# Patient Record
Sex: Female | Born: 1940 | Race: White | Hispanic: No | Marital: Married | Smoking: Never smoker
Health system: Southern US, Community
[De-identification: ages and names within clinical notes are randomized; demographics above are authoritative.]

## PROBLEM LIST (undated history)

## (undated) DIAGNOSIS — E785 Hyperlipidemia, unspecified: Secondary | ICD-10-CM

## (undated) DIAGNOSIS — T7840XA Allergy, unspecified, initial encounter: Secondary | ICD-10-CM

## (undated) DIAGNOSIS — F32A Depression, unspecified: Secondary | ICD-10-CM

## (undated) DIAGNOSIS — F419 Anxiety disorder, unspecified: Secondary | ICD-10-CM

## (undated) DIAGNOSIS — E119 Type 2 diabetes mellitus without complications: Secondary | ICD-10-CM

## (undated) DIAGNOSIS — F329 Major depressive disorder, single episode, unspecified: Secondary | ICD-10-CM

## (undated) DIAGNOSIS — I10 Essential (primary) hypertension: Secondary | ICD-10-CM

## (undated) HISTORY — DX: Anxiety disorder, unspecified: F41.9

## (undated) HISTORY — DX: Depression, unspecified: F32.A

## (undated) HISTORY — DX: Allergy, unspecified, initial encounter: T78.40XA

## (undated) HISTORY — DX: Essential (primary) hypertension: I10

## (undated) HISTORY — DX: Hyperlipidemia, unspecified: E78.5

## (undated) HISTORY — DX: Type 2 diabetes mellitus without complications: E11.9

## (undated) HISTORY — DX: Major depressive disorder, single episode, unspecified: F32.9

---

## 2006-11-18 ENCOUNTER — Encounter: Admission: RE | Admit: 2006-11-18 | Discharge: 2006-11-18 | Payer: Self-pay | Admitting: Family Medicine

## 2006-11-19 ENCOUNTER — Encounter: Admission: RE | Admit: 2006-11-19 | Discharge: 2006-11-19 | Payer: Self-pay | Admitting: Family Medicine

## 2008-09-13 ENCOUNTER — Encounter: Admission: RE | Admit: 2008-09-13 | Discharge: 2008-09-13 | Payer: Self-pay | Admitting: Family Medicine

## 2009-09-01 ENCOUNTER — Encounter: Admission: RE | Admit: 2009-09-01 | Discharge: 2009-09-01 | Payer: Self-pay | Admitting: Family Medicine

## 2010-02-01 ENCOUNTER — Encounter: Payer: Self-pay | Admitting: Family Medicine

## 2010-05-20 ENCOUNTER — Other Ambulatory Visit: Payer: Self-pay | Admitting: Orthopaedic Surgery

## 2010-05-20 ENCOUNTER — Encounter (HOSPITAL_COMMUNITY): Payer: Medicare Other | Attending: Orthopaedic Surgery

## 2010-05-20 DIAGNOSIS — M1610 Unilateral primary osteoarthritis, unspecified hip: Secondary | ICD-10-CM | POA: Insufficient documentation

## 2010-05-20 DIAGNOSIS — I10 Essential (primary) hypertension: Secondary | ICD-10-CM | POA: Insufficient documentation

## 2010-05-20 DIAGNOSIS — M169 Osteoarthritis of hip, unspecified: Secondary | ICD-10-CM | POA: Insufficient documentation

## 2010-05-20 DIAGNOSIS — Z01812 Encounter for preprocedural laboratory examination: Secondary | ICD-10-CM | POA: Insufficient documentation

## 2010-05-20 DIAGNOSIS — I2510 Atherosclerotic heart disease of native coronary artery without angina pectoris: Secondary | ICD-10-CM | POA: Insufficient documentation

## 2010-05-20 LAB — SURGICAL PCR SCREEN
MRSA, PCR: NEGATIVE
Staphylococcus aureus: POSITIVE — AB

## 2010-05-20 LAB — BASIC METABOLIC PANEL
BUN: 9 mg/dL (ref 6–23)
Calcium: 10.1 mg/dL (ref 8.4–10.5)
Chloride: 99 mEq/L (ref 96–112)
GFR calc Af Amer: >60 mL/min (ref >60)
Glucose, Bld: 96 mg/dL (ref 70–99)
Potassium: 4.3 mEq/L (ref 3.5–5.1)
Sodium: 136 mEq/L (ref 135–145)

## 2010-05-20 LAB — APTT: aPTT: 31 seconds (ref 24–37)

## 2010-05-20 LAB — CBC
MCHC: 33.8 g/dL (ref 30.0–36.0)
Platelets: 193 10*3/uL (ref 150–400)
RDW: 12.9 % (ref 11.5–15.5)
WBC: 6.4 10*3/uL (ref 4.0–10.5)

## 2010-05-20 LAB — PROTIME-INR: INR: 0.99 (ref 0.00–1.49)

## 2010-05-29 ENCOUNTER — Inpatient Hospital Stay (HOSPITAL_COMMUNITY): Payer: Medicare Other

## 2010-05-29 ENCOUNTER — Inpatient Hospital Stay (HOSPITAL_COMMUNITY)
Admission: RE | Admit: 2010-05-29 | Discharge: 2010-06-02 | DRG: 470 | Disposition: A | Discharge status: Skilled Nursing Facility | Payer: Medicare Other | Source: Ambulatory Visit | Attending: Orthopaedic Surgery | Admitting: Orthopaedic Surgery

## 2010-05-29 DIAGNOSIS — E785 Hyperlipidemia, unspecified: Secondary | ICD-10-CM | POA: Diagnosis present

## 2010-05-29 DIAGNOSIS — F411 Generalized anxiety disorder: Secondary | ICD-10-CM | POA: Diagnosis present

## 2010-05-29 DIAGNOSIS — Z01812 Encounter for preprocedural laboratory examination: Secondary | ICD-10-CM

## 2010-05-29 DIAGNOSIS — D62 Acute posthemorrhagic anemia: Secondary | ICD-10-CM | POA: Diagnosis not present

## 2010-05-29 DIAGNOSIS — M549 Dorsalgia, unspecified: Secondary | ICD-10-CM | POA: Diagnosis present

## 2010-05-29 DIAGNOSIS — I428 Other cardiomyopathies: Secondary | ICD-10-CM | POA: Diagnosis present

## 2010-05-29 DIAGNOSIS — M23305 Other meniscus derangements, unspecified medial meniscus, unspecified knee: Secondary | ICD-10-CM | POA: Diagnosis present

## 2010-05-29 DIAGNOSIS — R609 Edema, unspecified: Secondary | ICD-10-CM | POA: Diagnosis present

## 2010-05-29 DIAGNOSIS — M169 Osteoarthritis of hip, unspecified: Principal | ICD-10-CM | POA: Diagnosis present

## 2010-05-29 DIAGNOSIS — I10 Essential (primary) hypertension: Secondary | ICD-10-CM | POA: Diagnosis present

## 2010-05-29 DIAGNOSIS — M1610 Unilateral primary osteoarthritis, unspecified hip: Principal | ICD-10-CM | POA: Diagnosis present

## 2010-05-29 DIAGNOSIS — G8929 Other chronic pain: Secondary | ICD-10-CM | POA: Diagnosis present

## 2010-05-29 LAB — ABO/RH: ABO/RH(D): O NEG

## 2010-05-29 LAB — TYPE AND SCREEN: ABO/RH(D): O NEG

## 2010-05-30 LAB — BASIC METABOLIC PANEL
BUN: 11 mg/dL (ref 6–23)
Calcium: 8.3 mg/dL — ABNORMAL LOW (ref 8.4–10.5)
Chloride: 101 mEq/L (ref 96–112)
Potassium: 4.3 mEq/L (ref 3.5–5.1)

## 2010-05-30 LAB — CBC
HCT: 30.6 % — ABNORMAL LOW (ref 36.0–46.0)
Hemoglobin: 10.2 g/dL — ABNORMAL LOW (ref 12.0–15.0)
MCH: 31.4 pg (ref 26.0–34.0)
Platelets: 160 10*3/uL (ref 150–400)

## 2010-05-30 NOTE — Op Note (Signed)
Kaitlin Salazar, Kaitlin Salazar               ACCOUNT NO.:  [id number]  MEDICAL RECORD NO.:  [id number]           PATIENT TYPE:  I  LOCATION:  1601                         FACILITY:  [facility]  PHYSICIAN:  Vanita Panda. Magnus Ivan, M.D.DATE OF BIRTH:  Jun 12, 1940  DATE OF PROCEDURE:  05/29/2010 DATE OF DISCHARGE:                              OPERATIVE REPORT   PREOPERATIVE DIAGNOSES:  Severe osteoarthritis and degenerative joint disease, right hip.  POSTOPERATIVE DIAGNOSES:  Severe osteoarthritis and degenerative joint disease, right hip.  PROCEDURE:  Right total hip arthroplasty through direct anterior approach.  IMPLANTS:  DePuy acetabular Pinnacle  Sector cup size 52, neutral  +4 polyethylene liner, size 36 +1.5 ceramic femoral head, Corail size 11 femoral component (KLAL).  SURGEON:  Vanita Panda. Magnus Ivan, M.D.  ANESTHESIA:  General.  ANTIBIOTICS:  600 mg of IV clindamycin.  BLOOD LOSS:  650 cc.  COMPLICATIONS:  None. INDICATIONS:  Kaitlin Salazar is a 70 year old female debilitating end-stage osteoarthritis involving the right hip, it has bothered her quite significantly.  At this point, she wished to proceed with a total hip arthroplasty.  Risks and benefits have been explained to her in detail and she does wish to proceed with surgery given the effects on her activities of daily living.  PROCEDURE IN DETAIL:  After informed consent was obtained, appropriate right hip was marked and brought to the operating room, general anesthesia was obtained while she was on a stretcher.  A Foley catheter was placed and then traction boots were placed both on her feet.  She was placed on a Hana fracture table in supine position with the perineal post and both legs in large-scale retraction with no traction noted. Right hip was then prepped and draped with DuraPrep and sterile drapes and time-out was called to identify the correct patient and correct right hip.  I then made an incision 1 cm  distal and 3 cm posterior to the anterior superior iliac spine, dissected down to the tensor fascia lata.  The tensor fascia lata was then divided in longitudinal fashion and I proceeded with direct anterior approach to hip.  A Cobra retractor was placed over the lateral femoral neck and then I teased up the rectus femoris and placed Cobra retractor anteriorly.  I then divided the capsule and joint effusion was noted.  I placed the Cobra retractor __________.  I assessed the neck cut under direct fluoroscopic guidance and made a neck cut with an oscillating saw and finished this with an osteotome.  I placed a cork screw in the femoral head and removed in its entirety and found to have significant disease.  I then cleaned the acetabulum debris as well as the margin of the acetabulum and I began reaming from size 45 up to a size of 51 with last reamer placed under direct fluoroscopy.  A 51 was felt to have good fit with the reamers placed real size 52 acetabular component with 3 screw holes.  Once we knocked this in the placed, it was secured, but I still filled one of the screw holes.  I then placed real polyethylene liner and proceeded with femur.  Again, all tractions off, we put a hook for the Hana table underneath the vastus ridge of the femur and was able to externally rotate to 90 degrees, extend, and adduct the hip.  Retractors were placed under the greater trochanter and medially and I released tissue around the greater trochanter and I then used cookie cutter guide to open up the femoral canal and then began broaching with size 8 broached up to 11, the 11 was slightly __________, but it was felt to be stable, so I trialed the femoral head off of this which was 36 +1.5.  We reduced this into the acetabulum and leg lengths were near equal and they were stable with past 90 degrees of external rotation and 40 degrees of internal rotation.  The shuck was minimal.  I then removed all  trial components.  I placed the real Corail high offset stem size 11 with the collar and hydroxyapatite (HA coating).  I placed the real 36 +1 femoral head and reduced this into the acetabulum.  Again, it was felt to be stable.  We copiously irrigated tissues, closed the joint capsule with interrupted # 1 Ethibond sutures followed by #1 Vicryl running to close the tensor fascia lata, 2-0 Vicryl in the subcutaneous tissue and staples on the skin.  Xeroform followed by a well-padded dressing applied.  The patient was taken off from the [place] table.  Her leg lengths are felt to be equal.  She was taken to the recovery room in stable condition.  All final counts are correct.  There were no complications.     Vanita Panda. Magnus Ivan, M.D.     CYB/MEDQ  D:  05/29/2010  T:  05/30/2010  Job:  [id number]  Electronically Signed by Doneen Poisson M.D. on 05/30/2010 07:19:22 PM

## 2010-05-30 NOTE — H&P (Signed)
  Kaitlin Salazar, Kaitlin Salazar               ACCOUNT NO.:  [id number]  MEDICAL RECORD NO.:  [id number]           PATIENT TYPE:  I  LOCATION:  1601                         FACILITY:  [facility]  PHYSICIAN:  Vanita Panda. Magnus Ivan, M.D.DATE OF BIRTH:  Jul 10, 1940  DATE OF ADMISSION:  05/29/2010 DATE OF DISCHARGE:                             HISTORY & PHYSICAL   CHIEF COMPLAINT:  Right hip pain with known severe osteoarthritis.  HISTORY OF PRESENT ILLNESS:  Kaitlin Salazar is a 70 year old female who is scheduled to undergo a right hip replacement today.  She has had severe disease in the right hip for sometime now.  This affects her activities of daily living, has become very debilitated to her, and she has gotten worse.  She would like to proceed with a total hip arthroplasty.  I discussed this on numerous occasions and, however, daily life has been severely affected by this hip hurting her so significantly.  She understands risks and benefits of surgery and does wish to proceed due to the effects on her activities of daily living.  PAST MEDICAL PROBLEMS: 1. Anxiety. 2. Edema. 3. Dyslipidemia. 4. Onychomycosis. 5. Cardiomyopathy.  MEDICATIONS:  Carvedilol, Cymbalta, digoxin, enalapril, fexofenadine, furosemide, Klor-Con, Lipitor, lorazepam, nabumetone, nitroglycerin, tramadol, Tylenol, zolpidem.  ALLERGIES:  She has such a long list of allergies, it can be seen all around her medical record because these are numerous.  SOCIAL HISTORY:  She does live in [place], [place] with her husband who has Parkinson disease, and he is very close to her family.  REVIEW OF SYSTEMS:  Negative for chest pain, shortness of breath, fever, chills, nausea, vomiting.  PHYSICAL EXAMINATION:  VITAL SIGNS:  She is afebrile.  Stable vital signs. GENERAL:  She is actually alert, oriented x3, in no acute distress or obvious discomfort. HEENT:  Normocephalic, atraumatic.  Pupils are equal and reactive  to light. NECK:  Supple. LUNGS:  Clear to auscultation bilaterally. HEART:  Regular rate and rhythm. ABDOMEN:  Benign. EXTREMITIES:  Right hip shows severe pain with internal and external rotation.  IMPRESSION:  This is a 70 year old female with end-stage osteoarthritis of right hip.  PLAN:  We will proceed with a right total hip arthroplasty today.  Risks and benefits have been explained to her in detail.  She does wish to proceed with the surgery.     Vanita Panda. Magnus Ivan, M.D.     CYB/MEDQ  D:  05/29/2010  T:  05/30/2010  Job:  [id number]  Electronically Signed by Doneen Poisson M.D. on 05/30/2010 07:19:25 PM

## 2010-05-31 LAB — CBC
MCHC: 34.1 g/dL (ref 30.0–36.0)
MCV: 94.8 fL (ref 78.0–100.0)
RDW: 13.2 % (ref 11.5–15.5)
WBC: 15.1 10*3/uL — ABNORMAL HIGH (ref 4.0–10.5)

## 2010-05-31 LAB — URINALYSIS, ROUTINE W REFLEX MICROSCOPIC
Bilirubin Urine: NEGATIVE
Hgb urine dipstick: NEGATIVE
Ketones, ur: NEGATIVE mg/dL
Nitrite: NEGATIVE
Protein, ur: NEGATIVE mg/dL

## 2010-05-31 LAB — BASIC METABOLIC PANEL
BUN: 7 mg/dL (ref 6–23)
CO2: 23 mEq/L (ref 19–32)
Calcium: 8.8 mg/dL (ref 8.4–10.5)
Creatinine, Ser: 0.58 mg/dL (ref 0.4–1.2)
GFR calc non Af Amer: >60 mL/min (ref >60)
Glucose, Bld: 137 mg/dL — ABNORMAL HIGH (ref 70–99)
Potassium: 4.3 mEq/L (ref 3.5–5.1)

## 2010-05-31 LAB — URINE MICROSCOPIC-ADD ON

## 2010-06-01 LAB — CBC
Hemoglobin: 9.1 g/dL — ABNORMAL LOW (ref 12.0–15.0)
MCH: 31.1 pg (ref 26.0–34.0)
Platelets: 149 10*3/uL — ABNORMAL LOW (ref 150–400)
RDW: 12.9 % (ref 11.5–15.5)

## 2010-06-01 LAB — BASIC METABOLIC PANEL
BUN: 9 mg/dL (ref 6–23)
Calcium: 8.8 mg/dL (ref 8.4–10.5)
Creatinine, Ser: 0.5 mg/dL (ref 0.4–1.2)
GFR calc Af Amer: >60 mL/min (ref >60)
GFR calc non Af Amer: >60 mL/min (ref >60)
Glucose, Bld: 133 mg/dL — ABNORMAL HIGH (ref 70–99)
Potassium: 4 mEq/L (ref 3.5–5.1)
Sodium: 132 mEq/L — ABNORMAL LOW (ref 135–145)

## 2010-06-02 LAB — BASIC METABOLIC PANEL
CO2: 27 mEq/L (ref 19–32)
Calcium: 8.5 mg/dL (ref 8.4–10.5)
Chloride: 98 mEq/L (ref 96–112)
GFR calc Af Amer: >60 mL/min (ref >60)
GFR calc non Af Amer: >60 mL/min (ref >60)

## 2010-06-06 NOTE — Discharge Summary (Signed)
  [name], Kaitlin Salazar               ACCOUNT NO.:  [id number]  MEDICAL RECORD NO.:  [id number]           PATIENT TYPE:  I  LOCATION:  1601                         FACILITY:  [facility]  PHYSICIAN:  Vanita Panda. Magnus Ivan, M.D.DATE OF BIRTH:  1940-06-14  DATE OF ADMISSION:  05/29/2010 DATE OF DISCHARGE:  06/02/2010                              DISCHARGE SUMMARY   ADMISSION DIAGNOSIS:  Severe osteoarthritis and degenerative joint disease, right hip.  SECONDARY DIAGNOSES: 1. Cardiomyopathy. 2. Dyslipidemia. 3. Chronic edema. 4. Chronic back pain. 5. Anxiety. 6. Right knee chronic medial meniscus tear.  DISCHARGE DIAGNOSES:  Status post right total hip arthroplasty.  PROCEDURES:  Right total hip arthroplasty through direct anterior approach on 05/29/2010.  HOSPITAL COURSE:  Kaitlin Salazar is a 70 year old female who on the day of admission underwent a right elective total hip arthroplasty through direct anterior approach.  She was then admitted as an inpatient to the orthopedic floor after a successful surgery.  Postoperatively, she is working on mobility but due to anxiety and slow mobility, it is recommended that she needed short-term skilled nursing placement prior to being able to transition to home environment.  On the day of discharge, her incision was clean, dry and intact.  She was tolerating oral pain medications as well as oral diet.  It was felt that she could be discharged safely to a skilled nursing facility.  DISPOSITION:  Discharged to skilled nursing facility.  DISCHARGE INSTRUCTIONS:  While she is at the skilled nursing facility, she can work on balance, gait training and coordination with weight- bearing as tolerated on her right hip and no hip precautions. A followup appointment should be established with Dr. Magnus Ivan at [facility] in 2 weeks after discharge.  DISCHARGE MEDICATIONS: 1. Xarelto 10 mg p.o. daily for 3 weeks. 2. Loperamide 2 mg p.o. q.4 h  p.r.n. 3. Tramadol 50 mg 1 to 2 tablets p.o. q.8 to 12 hours p.r.n. pain. 4. Nitroglycerin sublingual 0.4 mg p.o. p.r.n. 5. Therapeutic multivitamin 1 p.o. daily. 6. Fish oil 1200 mg p.o. 3 capsules daily. 7. Vitamin B12 1 tablet p.o. daily. 8. Lorazepam 1 mg, 1/2 to 1 tablet twice daily p.r.n. 9. Klor-Con 10 mEq p.o. 2 tablets twice daily. 10.Lasix 40 mg 1 to 2 tablets q.a.m. 11.Fexofenadine 180 mg p.o. daily p.r.n. 12.Enalapril 2.5 mg p.o. b.i.d. 13.Digoxin 0.125 mg p.o. q.a.m. 14.Cymbalta 20 mg p.o. daily at bedtime. 15.Carvedilol 3.125 mg p.o. daily at bedtime. 16.Carvedilol 12.5 mg p.o. b.i.d. 17.Atorvastatin 10 mg p.o. daily at bedtime. 18.Robaxin 500 mg 1 p.o. q.6 h p.r.n. spasms. 19.Percocet 5/325 1 to 2 p.o. q.4 to 6 h p.r.n. pain.     Vanita Panda. Magnus Ivan, M.D.     CYB/MEDQ  D:  06/01/2010  T:  06/01/2010  Job:  [id number]  Electronically Signed by Doneen Poisson M.D. on 06/06/2010 11:10:34 AM

## 2012-06-17 DIAGNOSIS — K219 Gastro-esophageal reflux disease without esophagitis: Secondary | ICD-10-CM | POA: Insufficient documentation

## 2012-06-17 DIAGNOSIS — I10 Essential (primary) hypertension: Secondary | ICD-10-CM | POA: Insufficient documentation

## 2012-06-17 DIAGNOSIS — E785 Hyperlipidemia, unspecified: Secondary | ICD-10-CM | POA: Insufficient documentation

## 2012-06-17 DIAGNOSIS — M797 Fibromyalgia: Secondary | ICD-10-CM | POA: Insufficient documentation

## 2012-06-17 DIAGNOSIS — E7800 Pure hypercholesterolemia, unspecified: Secondary | ICD-10-CM | POA: Insufficient documentation

## 2013-05-22 DIAGNOSIS — F3181 Bipolar II disorder: Secondary | ICD-10-CM | POA: Insufficient documentation

## 2014-04-29 DIAGNOSIS — F411 Generalized anxiety disorder: Secondary | ICD-10-CM | POA: Insufficient documentation

## 2014-08-08 ENCOUNTER — Other Ambulatory Visit: Payer: Self-pay | Admitting: Sports Medicine

## 2014-08-21 ENCOUNTER — Other Ambulatory Visit: Payer: Self-pay | Admitting: Sports Medicine

## 2014-08-21 DIAGNOSIS — M542 Cervicalgia: Secondary | ICD-10-CM

## 2014-09-03 ENCOUNTER — Ambulatory Visit
Admission: RE | Admit: 2014-09-03 | Discharge: 2014-09-03 | Disposition: A | Discharge status: Home / Self Care | Payer: Medicare Other | Source: Ambulatory Visit | Attending: Sports Medicine | Admitting: Sports Medicine

## 2014-09-03 DIAGNOSIS — M542 Cervicalgia: Secondary | ICD-10-CM

## 2014-11-26 DIAGNOSIS — I429 Cardiomyopathy, unspecified: Secondary | ICD-10-CM | POA: Insufficient documentation

## 2014-11-26 DIAGNOSIS — I428 Other cardiomyopathies: Secondary | ICD-10-CM | POA: Insufficient documentation

## 2014-11-26 DIAGNOSIS — R002 Palpitations: Secondary | ICD-10-CM | POA: Insufficient documentation

## 2015-02-03 DIAGNOSIS — L304 Erythema intertrigo: Secondary | ICD-10-CM | POA: Insufficient documentation

## 2015-02-03 DIAGNOSIS — I10 Essential (primary) hypertension: Secondary | ICD-10-CM | POA: Insufficient documentation

## 2015-02-03 DIAGNOSIS — E119 Type 2 diabetes mellitus without complications: Secondary | ICD-10-CM | POA: Insufficient documentation

## 2015-04-20 DIAGNOSIS — E119 Type 2 diabetes mellitus without complications: Secondary | ICD-10-CM | POA: Insufficient documentation

## 2015-04-20 DIAGNOSIS — H33329 Round hole, unspecified eye: Secondary | ICD-10-CM | POA: Insufficient documentation

## 2015-04-20 DIAGNOSIS — H2510 Age-related nuclear cataract, unspecified eye: Secondary | ICD-10-CM | POA: Insufficient documentation

## 2015-04-20 DIAGNOSIS — H02831 Dermatochalasis of right upper eyelid: Secondary | ICD-10-CM | POA: Insufficient documentation

## 2015-04-20 DIAGNOSIS — H01139 Eczematous dermatitis of unspecified eye, unspecified eyelid: Secondary | ICD-10-CM | POA: Insufficient documentation

## 2015-04-20 DIAGNOSIS — H35363 Drusen (degenerative) of macula, bilateral: Secondary | ICD-10-CM | POA: Insufficient documentation

## 2015-06-13 ENCOUNTER — Other Ambulatory Visit: Payer: Self-pay | Admitting: Sports Medicine

## 2015-06-13 DIAGNOSIS — M545 Low back pain: Secondary | ICD-10-CM

## 2015-06-26 DIAGNOSIS — R079 Chest pain, unspecified: Secondary | ICD-10-CM | POA: Insufficient documentation

## 2015-06-26 DIAGNOSIS — R410 Disorientation, unspecified: Secondary | ICD-10-CM | POA: Insufficient documentation

## 2015-06-26 DIAGNOSIS — R4182 Altered mental status, unspecified: Secondary | ICD-10-CM | POA: Insufficient documentation

## 2015-08-06 DIAGNOSIS — H40053 Ocular hypertension, bilateral: Secondary | ICD-10-CM | POA: Insufficient documentation

## 2015-09-09 DIAGNOSIS — H40022 Open angle with borderline findings, high risk, left eye: Secondary | ICD-10-CM | POA: Insufficient documentation

## 2015-09-25 ENCOUNTER — Other Ambulatory Visit: Payer: Self-pay | Admitting: Sports Medicine

## 2015-09-25 DIAGNOSIS — R531 Weakness: Secondary | ICD-10-CM

## 2015-09-25 DIAGNOSIS — M545 Low back pain: Secondary | ICD-10-CM

## 2015-09-25 DIAGNOSIS — M48061 Spinal stenosis, lumbar region without neurogenic claudication: Secondary | ICD-10-CM

## 2015-10-04 ENCOUNTER — Other Ambulatory Visit: Payer: Medicare Other

## 2015-10-14 ENCOUNTER — Other Ambulatory Visit: Payer: Medicare Other

## 2015-10-20 ENCOUNTER — Other Ambulatory Visit: Payer: Medicare Other

## 2015-10-21 ENCOUNTER — Inpatient Hospital Stay: Admission: RE | Admit: 2015-10-21 | Payer: Medicare Other | Source: Ambulatory Visit

## 2016-01-20 DIAGNOSIS — I493 Ventricular premature depolarization: Secondary | ICD-10-CM | POA: Insufficient documentation

## 2016-02-12 DIAGNOSIS — D696 Thrombocytopenia, unspecified: Secondary | ICD-10-CM | POA: Insufficient documentation

## 2016-02-20 ENCOUNTER — Ambulatory Visit: Payer: Medicare Other | Admitting: Sports Medicine

## 2016-03-05 ENCOUNTER — Other Ambulatory Visit: Payer: Self-pay | Admitting: Sports Medicine

## 2016-03-05 NOTE — Telephone Encounter (Signed)
Last refill was 10/2015. Not sure when last OV was so will send to Dr. Berline Chough to review.

## 2016-03-08 NOTE — Telephone Encounter (Signed)
rx called in to [facility], Joni Reining.

## 2016-03-10 ENCOUNTER — Ambulatory Visit (INDEPENDENT_AMBULATORY_CARE_PROVIDER_SITE_OTHER): Payer: Medicare Other | Admitting: Sports Medicine

## 2016-03-10 ENCOUNTER — Encounter: Payer: Self-pay | Admitting: Sports Medicine

## 2016-03-10 VITALS — BP 132/78 | HR 68 | Ht 64.0 in | Wt 182.6 lb

## 2016-03-10 DIAGNOSIS — M4726 Other spondylosis with radiculopathy, lumbar region: Secondary | ICD-10-CM | POA: Diagnosis not present

## 2016-03-10 DIAGNOSIS — M1712 Unilateral primary osteoarthritis, left knee: Secondary | ICD-10-CM | POA: Diagnosis not present

## 2016-03-10 DIAGNOSIS — M25511 Pain in right shoulder: Secondary | ICD-10-CM

## 2016-03-10 DIAGNOSIS — M4722 Other spondylosis with radiculopathy, cervical region: Secondary | ICD-10-CM | POA: Diagnosis not present

## 2016-03-10 DIAGNOSIS — G8929 Other chronic pain: Secondary | ICD-10-CM

## 2016-03-10 DIAGNOSIS — E119 Type 2 diabetes mellitus without complications: Secondary | ICD-10-CM | POA: Diagnosis not present

## 2016-03-10 NOTE — Patient Instructions (Signed)
We will order the MRI for you and will plan to see you back after this has been obtained.

## 2016-03-10 NOTE — Progress Notes (Signed)
Kaitlin Salazar - 77 y.o. female MRN [id number]  Date of birth: 1940/03/21  Office Visit Note: Visit Date: 03/10/2016 PCP: Deforest Hoyles, MD Referred by: Lyndel Safe., MD  Subjective: Chief Complaint  Patient presents with  . pain in neck, back and knee   HPI: Patient presents for follow-up of right shoulder, neck, and left knee pain.  He has been chronic ongoing issues with her for quite some time.  She is recently followed up with her primary care physician and these notes were reviewed today.  She denies any recent falls but does continue to have issues going up and down steps.  She is not interested in joint replacement at this time.  She is interested in obtaining an MRI of her lumbar spine which has been ordered due to the ongoing leg pain that she has had but she has not followed up with this. ROS: Denies fevers, chills, recent weight gain or weight loss.  No night sweats. No significant nighttime awakenings due to this issue.  Otherwise per HPI.  Objective:  VS:  HT:5\' 4"  (162.6 cm)   WT:182 lb 9.6 oz (82.8 kg)  BMI:31.4    BP:132/78  HR:68bpm  TEMP: ( )  RESP:97 % Physical Exam: GENERAL:  WDWN, NAD, Non-toxic appearing PSYCH:  Alert & appropriately interactive  Not depressed or anxious appearing UPPER & LOWER EXTREMITIES:  No significant rashes/lesions/ulcerations overlying the arms or legs.  No significant generalized UE edema.  No significant pretibial edema  No clubbing or cyanosis.  Radial pulses 2+/4.  DP & PT pulses 2+/4  Sensation intact to light touch in upper and lower extremities. RIGHT SHOULDER: Patient has pain with brachial plexus squeeze as well as with empty can testing.   Left knee: Generalized osteoarthritic bossing.  No significant effusion.  Stable to varus and valgus strain.  She has generalized tenderness to palpation diffusely.  No focal mechanical symptoms with McMurray's testing.   Imaging & Procedures: No results  found.  PROCEDURE NOTE: Left knee injection  The patients clinical condition is marked by substantial pain and/or significant functional disability. Other conservative therapy has not provided relief, is contraindicated, or not appropriate. There is a reasonable likelihood that injection will significantly improve the patients pain and/or functional impairment.  After discussing the risks, benefits and expected outcomes of the injection and all questions were reviewed and answered, the patient wished to undergo the above named procedure.  Verbal consent was obtained. After an appropriate time out was taken the target structure prepped and aspirated and injected as below: PREP: Alcohol, Ethel Chloride APPROACH: Bent knee anterior medial, single injection, 22g 1.5 needle INJECTATE: 3 cc 1% lidocaine, 1 cc 40 mg Depo-Medrol ASPIRATE: N/A DRESSING: Band-Aid Post procedural instructions including recommending icing and warning signs for infection were reviewed.  This procedure was well tolerated and there were no complications.    PROCEDURE NOTE: Right shoulder injection  The patients clinical condition is marked by substantial pain and/or significant functional disability. Other conservative therapy has not provided relief, is contraindicated, or not appropriate. There is a reasonable likelihood that injection will significantly improve the patients pain and/or functional impairment.  After discussing the risks, benefits and expected outcomes of the injection and all questions were reviewed and answered, the patient wished to undergo the above named procedure.  Verbal consent was obtained. After an appropriate time out was taken the target structure prepped and aspirated and injected as below: PREP: Alcohol, Ethel Chloride APPROACH: Posterior, single injection, 22g 1.5  needle INJECTATE: 3 cc 1% lidocaine, 1 cc 40 mg Depo-Medrol ASPIRATE: N/A DRESSING: Band-Aid Post procedural instructions  including recommending icing and warning signs for infection were reviewed.  This procedure was well tolerated and there were no complications.    Assessment & Plan: Problem List Items Addressed This Visit    Type 2 diabetes mellitus without complication, without long-term current use of insulin (HCC)    Cautioned on increasing sugars with steroid today patient is aware of risks.      Relevant Medications   atorvastatin (LIPITOR) 20 MG tablet   lisinopril (PRINIVIL,ZESTRIL) 2.5 MG tablet   Knee osteoarthritis - Primary    Left knee injection performed today.  She is not interested in any further intervention at this time.        Osteoarthritis of spine with radiculopathy, cervical region    Right shoulder neck pain is likely coming from some underlying shoulder impingement as well as with radicular component from cervical spine.  Right shoulder injection performed today.      Osteoarthritis of spine with radiculopathy, lumbar region    Patient does have pain that radiates into bilateral legs that does not completely fit with isolated hip or knee arthritis.  MRI of lumbar spine as previously been ordered and this will be requested again.  We will plan follow-up with her after this is obtained.      Relevant Medications   DULoxetine (CYMBALTA) 20 MG capsule   gabapentin (NEURONTIN) 300 MG capsule   LORazepam (ATIVAN) 1 MG tablet    Other Visit Diagnoses    Chronic right shoulder pain          Follow-up: Return in about 6 weeks (around 04/21/2016).   Past Medical/Family/Surgical/Social History: Medications & Allergies reviewed per EMR Patient Active Problem List   Diagnosis Date Noted  . Knee osteoarthritis 03/17/2016  . Osteoarthritis of spine with radiculopathy, cervical region 03/17/2016  . Osteoarthritis of spine with radiculopathy, lumbar region 03/17/2016  . Thrombocytopenia (HCC) 02/12/2016  . PVC (premature ventricular contraction) 01/20/2016  . Controlled type 2  diabetes mellitus without complication, without long-term current use of insulin (HCC) 04/20/2015  . Benign essential HTN 02/03/2015  . Intertrigo 02/03/2015  . Type 2 diabetes mellitus without complication, without long-term current use of insulin (HCC) 02/03/2015  . Idiopathic cardiomyopathy (HCC) 11/26/2014  . Generalized anxiety disorder 04/29/2014  . Bipolar 2 disorder (HCC) 05/22/2013  . Esophageal reflux 06/17/2012  . Essential hypertension 06/17/2012  . Fibromyalgia 06/17/2012  . Hypercholesterolemia 06/17/2012  . Hyperlipidemia 06/17/2012   Past Medical History:  Diagnosis Date  . Allergy   . Anxiety   . Depression   . Diabetes mellitus without complication (HCC)   . Hyperlipidemia   . Hypertension    History reviewed. No pertinent family history. History reviewed. No pertinent surgical history. Social History   Occupational History  . Not on file.   Social History Main Topics  . Smoking status: Never Smoker  . Smokeless tobacco: Never Used  . Alcohol use No  . Drug use: Unknown  . Sexual activity: Not on file

## 2016-03-17 DIAGNOSIS — M4726 Other spondylosis with radiculopathy, lumbar region: Secondary | ICD-10-CM | POA: Insufficient documentation

## 2016-03-17 DIAGNOSIS — M179 Osteoarthritis of knee, unspecified: Secondary | ICD-10-CM | POA: Insufficient documentation

## 2016-03-17 DIAGNOSIS — M1710 Unilateral primary osteoarthritis, unspecified knee: Secondary | ICD-10-CM | POA: Insufficient documentation

## 2016-03-17 DIAGNOSIS — M4722 Other spondylosis with radiculopathy, cervical region: Secondary | ICD-10-CM | POA: Insufficient documentation

## 2016-03-17 NOTE — Assessment & Plan Note (Signed)
Right shoulder neck pain is likely coming from some underlying shoulder impingement as well as with radicular component from cervical spine.  Right shoulder injection performed today.

## 2016-03-17 NOTE — Assessment & Plan Note (Signed)
Left knee injection performed today.  She is not interested in any further intervention at this time.

## 2016-03-17 NOTE — Assessment & Plan Note (Signed)
Patient does have pain that radiates into bilateral legs that does not completely fit with isolated hip or knee arthritis.  MRI of lumbar spine as previously been ordered and this will be requested again.  We will plan follow-up with her after this is obtained.

## 2016-03-17 NOTE — Assessment & Plan Note (Signed)
Cautioned on increasing sugars with steroid today patient is aware of risks.

## 2016-03-19 ENCOUNTER — Other Ambulatory Visit: Payer: Self-pay | Admitting: Sports Medicine

## 2016-03-19 DIAGNOSIS — M4726 Other spondylosis with radiculopathy, lumbar region: Secondary | ICD-10-CM

## 2016-04-06 ENCOUNTER — Ambulatory Visit
Admission: RE | Admit: 2016-04-06 | Discharge: 2016-04-06 | Disposition: A | Discharge status: Home / Self Care | Payer: Medicare Other | Source: Ambulatory Visit | Attending: Sports Medicine | Admitting: Sports Medicine

## 2016-04-06 DIAGNOSIS — M4726 Other spondylosis with radiculopathy, lumbar region: Secondary | ICD-10-CM

## 2016-04-21 ENCOUNTER — Ambulatory Visit (INDEPENDENT_AMBULATORY_CARE_PROVIDER_SITE_OTHER): Payer: Medicare Other | Admitting: Sports Medicine

## 2016-04-21 ENCOUNTER — Encounter: Payer: Self-pay | Admitting: Sports Medicine

## 2016-04-21 VITALS — BP 138/80 | HR 78 | Ht 64.0 in | Wt 188.8 lb

## 2016-04-21 DIAGNOSIS — M25511 Pain in right shoulder: Secondary | ICD-10-CM | POA: Diagnosis not present

## 2016-04-21 DIAGNOSIS — M1712 Unilateral primary osteoarthritis, left knee: Secondary | ICD-10-CM

## 2016-04-21 DIAGNOSIS — E119 Type 2 diabetes mellitus without complications: Secondary | ICD-10-CM | POA: Diagnosis not present

## 2016-04-21 DIAGNOSIS — F411 Generalized anxiety disorder: Secondary | ICD-10-CM

## 2016-04-21 DIAGNOSIS — M797 Fibromyalgia: Secondary | ICD-10-CM

## 2016-04-21 DIAGNOSIS — M4722 Other spondylosis with radiculopathy, cervical region: Secondary | ICD-10-CM

## 2016-04-21 DIAGNOSIS — M4726 Other spondylosis with radiculopathy, lumbar region: Secondary | ICD-10-CM

## 2016-04-21 DIAGNOSIS — G8929 Other chronic pain: Secondary | ICD-10-CM

## 2016-04-21 MED ORDER — GABAPENTIN 100 MG PO CAPS: ORAL_CAPSULE | ORAL | 1 refills | Status: DC

## 2016-04-21 MED ORDER — TRAMADOL HCL 50 MG PO TABS: ORAL_TABLET | ORAL | 1 refills | Status: DC

## 2016-04-21 NOTE — Progress Notes (Addendum)
OFFICE VISIT NOTE Kaitlin Salazar. Kaitlin Salazar [facility] at [facility] [phone]  Kaitlin Salazar - 77 y.o. female MRN [id number]  Date of birth: [birth year]  Visit Date: 04/21/2016  PCP: Lyndel Safe., MD   Referred by: Lyndel Safe., MD  SUBJECTIVE:   Chief Complaint  Patient presents with  . osteoarthritis left knee/right shoulder    Shoulder: Pain is present in both as stated by patient."doesn't want to do anything" because the pain is so bad      Knee: grinding feeling as state by patient. "scared to go up/down stairs in home".      Additional bilateral ankle pain   HPI: As above. Additional pertinent information includes:  Patient presents with pain in: Right shoulder: Pain is present in both as stated by patient."doesn't want to do anything" because the pain is so bad        Knee: grinding feeling as state by patient. "scared to go up/down stairs in home".    She denies any interim falls but has had multiple falls in the past.  The results that she has had from her underlying knee injections in the past have been moderately helpful and she is interested in 1 again today.  Additional bilateral ankle pain   Here to follow-up on results of MRI Lumbar Spine 03/2016: IMPRESSION: 1. Severe and diffuse degenerative changes that has progressed from 2010. 2. L1-2 moderate to advanced spinal stenosis. Left subarticular recess and foraminal impingement. 3. L2-3 high-grade spinal stenosis with impingement in the subarticular recesses. 4. L3-4 high-grade spinal and right foraminal stenosis. Right foraminal L3 compression. 5. L4-5 severe spinal and right foraminal stenosis. 6. L5-S1 left more than right subarticular recess stenosis and impingement. Left foraminal L5 impingement.   ROS: Review of Systems  Constitutional: Negative for chills, fever and weight loss.  Cardiovascular: Negative for leg swelling.  Skin: Negative for rash.    Neurological: Negative for sensory change and focal weakness.    Otherwise per HPI.  HISTORY & PERTINENT PRIOR DATA:  No specialty comments available. She reports that she has never smoked. She has never used smokeless tobacco. No results for input(s): HGBA1C, LABURIC in the last 8760 hours. Medications & Allergies reviewed per EMR Patient Active Problem List   Diagnosis Date Noted  . Knee osteoarthritis 03/17/2016  . Osteoarthritis of spine with radiculopathy, cervical region 03/17/2016  . Osteoarthritis of spine with radiculopathy, lumbar region 03/17/2016  . Thrombocytopenia (HCC) 02/12/2016  . PVC (premature ventricular contraction) 01/20/2016  . Controlled type 2 diabetes mellitus without complication, without long-term current use of insulin (HCC) 04/20/2015  . Benign essential HTN 02/03/2015  . Intertrigo 02/03/2015  . Type 2 diabetes mellitus without complication, without long-term current use of insulin (HCC) 02/03/2015  . Idiopathic cardiomyopathy (HCC) 11/26/2014  . Generalized anxiety disorder 04/29/2014  . Bipolar 2 disorder (HCC) 05/22/2013  . Esophageal reflux 06/17/2012  . Essential hypertension 06/17/2012  . Fibromyalgia 06/17/2012  . Hypercholesterolemia 06/17/2012  . Hyperlipidemia 06/17/2012   Past Medical History:  Diagnosis Date  . Allergy   . Anxiety   . Depression   . Diabetes mellitus without complication (HCC)   . Hyperlipidemia   . Hypertension    No family history on file. No past surgical history on file. Social History   Occupational History  . Not on file.   Social History Main Topics  . Smoking status: Never Smoker  . Smokeless tobacco: Never Used  .  Alcohol use No  . Drug use: Unknown  . Sexual activity: Not on file    OBJECTIVE:  VS:  HT:5\' 4"  (162.6 cm)   WT:188 lb 12.8 oz (85.6 kg)  BMI:32.5    BP:138/80  HR:78bpm  TEMP: ( )  RESP:94 % Physical Exam Findings:  WDWN, NAD, Non-toxic appearing Alert & appropriately  interactive Not depressed or anxious appearing No increased work of breathing. Pupils are equal. EOM intact without nystagmus No clubbing or cyanosis of the extremities appreciated No significant rashes/lesions/ulcerations overlying the examined area. Radial pulses 2+/4.  No significant generalized UE edema. DP and PT pulses 1+/4.  Trace pitting edema No clubbing or cyanosis Sensation is diminished to light touch bilateral lower extremities diffusely.  Generalized dysesthesia. Bilateral upper extremities generalized tenderness over the right brachial plexus. She has pain with straight leg raise bilaterally and pain within the popliteal fossa as well as greater sciatic notch. No significant effusion bilaterally of her bilateral knees with right midline incision from total knee arthroplasty that is well-healed. Left knee has only a small effusion but marked pain with varus and valgus stressing.  3-4 mm of opening with varus testing. Right shoulder: Internal and external rotation strength is intact.  She has weakness diffusely in the bilateral upper extremities with empty can testing, speeds testing and O'Brien's testing are all equivocal.  Pain with Hawkins bilaterally. She has had heightened pain response with distraction of any of the above maneuvers are pain-free.    ASSESSMENT & PLAN:   Problem List Items Addressed This Visit    Fibromyalgia    This does complicate treatment and management decisions given the amount of pain and disability that she is having is extenuated by underlying fibromyalgia.  I do think that majority of her symptoms that are truly interfering with her day-to-day quality of life and ability to ambulate and function safely are related to the underlying structural issues that symptom magnification as a part of this process but not the entire aspect.  She does often times received more benefit from the medications and procedures she undergoes than what she gives credit for  and this should be considered in helping with management decisions.      Generalized anxiety disorder   Type 2 diabetes mellitus without complication, without long-term current use of insulin (HCC)   Knee osteoarthritis - Primary    Patient does have underlying knee osteoarthritis for this is fairly mild and she does not complain focally of the left knee being the only issue is more of a generalized leg and knee pain she describes.  Multiple repeat/serial injections of the left knee have not provided any meaningful benefit to her.  She reports that it seems as though her entire leg will give out on her when she has a falls she reports as above.  Once again these have not occurred recently but she does continue to have symptoms of giving way of her entire leg.  This does seem more consistent with a neurogenic cause especially given the findings at obtained on MRI.  She was hesitant to get this previously but now with these new results I do think further surgical evaluation is warranted for the discussion of possible treatment options.      Relevant Medications   traMADol (ULTRAM) 50 MG tablet   Osteoarthritis of spine with radiculopathy, cervical region    See above      Relevant Medications   LORazepam (ATIVAN) 1 MG tablet   gabapentin (NEURONTIN)  100 MG capsule   traMADol (ULTRAM) 50 MG tablet   Other Relevant Orders   Ambulatory referral to Neurosurgery   Osteoarthritis of spine with radiculopathy, lumbar region    Given the significant findings as well as symptoms as outlined in the above problem neurosurgical evaluation warranted at this time.  Await for their opinion regarding potential surgical options that we recognize that she is not a great surgical candidate.  The significant changes that she is having in both her cervical and lumbar spine have been significantly life altering.  We will try to have her resume gabapentin and have her start on a lower dose option throughout the day to  see if we can get her some relief in the interim.      Relevant Medications   LORazepam (ATIVAN) 1 MG tablet   gabapentin (NEURONTIN) 100 MG capsule   traMADol (ULTRAM) 50 MG tablet   Other Relevant Orders   Ambulatory referral to Neurosurgery    Other Visit Diagnoses    Chronic right shoulder pain       Relevant Medications   gabapentin (NEURONTIN) 100 MG capsule   traMADol (ULTRAM) 50 MG tablet      Follow-up: Return if symptoms worsen or fail to improve.   Otherwise please see problem oriented charting as below.

## 2016-05-31 NOTE — Assessment & Plan Note (Signed)
Given the significant findings as well as symptoms as outlined in the above problem neurosurgical evaluation warranted at this time.  Await for their opinion regarding potential surgical options that we recognize that she is not a great surgical candidate.  The significant changes that she is having in both her cervical and lumbar spine have been significantly life altering.  We will try to have her resume gabapentin and have her start on a lower dose option throughout the day to see if we can get her some relief in the interim.

## 2016-05-31 NOTE — Assessment & Plan Note (Signed)
See above

## 2016-05-31 NOTE — Assessment & Plan Note (Signed)
Patient does have underlying knee osteoarthritis for this is fairly mild and she does not complain focally of the left knee being the only issue is more of a generalized leg and knee pain she describes.  Multiple repeat/serial injections of the left knee have not provided any meaningful benefit to her.  She reports that it seems as though her entire leg will give out on her when she has a falls she reports as above.  Once again these have not occurred recently but she does continue to have symptoms of giving way of her entire leg.  This does seem more consistent with a neurogenic cause especially given the findings at obtained on MRI.  She was hesitant to get this previously but now with these new results I do think further surgical evaluation is warranted for the discussion of possible treatment options.

## 2016-05-31 NOTE — Assessment & Plan Note (Signed)
This does complicate treatment and management decisions given the amount of pain and disability that she is having is extenuated by underlying fibromyalgia.  I do think that majority of her symptoms that are truly interfering with her day-to-day quality of life and ability to ambulate and function safely are related to the underlying structural issues that symptom magnification as a part of this process but not the entire aspect.  She does often times received more benefit from the medications and procedures she undergoes than what she gives credit for and this should be considered in helping with management decisions.

## 2016-06-02 ENCOUNTER — Ambulatory Visit: Payer: Medicare Other | Admitting: Sports Medicine

## 2016-06-08 ENCOUNTER — Ambulatory Visit: Payer: Medicare Other | Admitting: Sports Medicine

## 2016-06-08 DIAGNOSIS — Z0289 Encounter for other administrative examinations: Secondary | ICD-10-CM

## 2016-06-11 ENCOUNTER — Ambulatory Visit (INDEPENDENT_AMBULATORY_CARE_PROVIDER_SITE_OTHER): Payer: Medicare Other | Admitting: Sports Medicine

## 2016-06-11 VITALS — BP 128/70 | HR 77 | Ht 64.0 in | Wt 183.8 lb

## 2016-06-11 DIAGNOSIS — E119 Type 2 diabetes mellitus without complications: Secondary | ICD-10-CM

## 2016-06-11 DIAGNOSIS — M25511 Pain in right shoulder: Secondary | ICD-10-CM | POA: Diagnosis not present

## 2016-06-11 DIAGNOSIS — M4722 Other spondylosis with radiculopathy, cervical region: Secondary | ICD-10-CM

## 2016-06-11 DIAGNOSIS — G8929 Other chronic pain: Secondary | ICD-10-CM

## 2016-06-11 DIAGNOSIS — M1712 Unilateral primary osteoarthritis, left knee: Secondary | ICD-10-CM

## 2016-06-11 DIAGNOSIS — R413 Other amnesia: Secondary | ICD-10-CM

## 2016-06-11 DIAGNOSIS — F3181 Bipolar II disorder: Secondary | ICD-10-CM

## 2016-06-11 DIAGNOSIS — M797 Fibromyalgia: Secondary | ICD-10-CM | POA: Diagnosis not present

## 2016-06-11 DIAGNOSIS — M4726 Other spondylosis with radiculopathy, lumbar region: Secondary | ICD-10-CM

## 2016-06-11 DIAGNOSIS — M25512 Pain in left shoulder: Secondary | ICD-10-CM

## 2016-06-11 MED ORDER — DULOXETINE HCL 30 MG PO CPEP: 30.0000 mg | ORAL_CAPSULE | Freq: Every day | ORAL | 1 refills | Status: DC

## 2016-06-11 NOTE — Progress Notes (Signed)
OFFICE VISIT NOTE Kaitlin Salazar. Kaitlin Salazar [name] [phone]  Kaitlin Salazar - 76 y.o. female MRN [id number]  Date of birth: 03/30/1940  Visit Date: 06/11/2016  PCP: Lyndel Safe., MD   Referred by: Lyndel Safe., MD  Kaitlin Salazar, cma acting as scribe for Dr. Berline Chough.  SUBJECTIVE:   Chief Complaint  Patient presents with  . Follow-up Back/Knee Pain   HPI: As below and per problem based documentation when appropriate.  Pt presents today in follow-up of back and bilateral knee pain, left worse than right. Pt reports that she just "ache(s) all over". She takes Tramadol with some relief but this causes her to be sleepy. Pt reports difficulty walking d/t body pains.  Pt reports that she is always sweating but always feels cold.  Daughter reports that she miss placed her duloxetine about 2-3 weeks ago and she hasn't been able to get them refilled again. She has had increased sadness and depression since she has been out of her medication.    Additionally they have not been able to schedule a consultation with [facility] and report date never received a phone call regarding this.  Unfortunately Kaitlin Salazar cannot actually recall that we discussed this at her last visit she is quite tearful during the encounter.    Review of Systems  Constitutional: Positive for chills. Negative for fever.  HENT: Positive for ear pain.   Respiratory: Negative for shortness of breath and wheezing.   Cardiovascular: Negative for chest pain, palpitations and leg swelling.  Musculoskeletal: Positive for back pain and joint pain. Negative for falls.  Neurological: Positive for dizziness, tingling (fingers ) and headaches.  Salazar/Heme/Allergies: Does not bruise/bleed easily.    Otherwise per HPI.  HISTORY & PERTINENT PRIOR DATA:  No specialty comments available. She reports that she has never smoked. She has never used smokeless tobacco. No  results for input(s): HGBA1C, LABURIC in the last 8760 hours. Medications & Allergies reviewed per EMR Patient Active Problem List   Diagnosis Date Noted  . Memory loss 06/14/2016  . Shoulder pain, bilateral 06/11/2016  . Knee osteoarthritis 03/17/2016  . Osteoarthritis of spine with radiculopathy, cervical region 03/17/2016  . Osteoarthritis of spine with radiculopathy, lumbar region 03/17/2016  . Thrombocytopenia (HCC) 02/12/2016  . PVC (premature ventricular contraction) 01/20/2016  . Controlled type 2 diabetes mellitus without complication, without long-term current use of insulin (HCC) 04/20/2015  . Benign essential HTN 02/03/2015  . Intertrigo 02/03/2015  . Type 2 diabetes mellitus without complication, without long-term current use of insulin (HCC) 02/03/2015  . Idiopathic cardiomyopathy (HCC) 11/26/2014  . Generalized anxiety disorder 04/29/2014  . Bipolar 2 disorder (HCC) 05/22/2013  . Esophageal reflux 06/17/2012  . Essential hypertension 06/17/2012  . Fibromyalgia 06/17/2012  . Hypercholesterolemia 06/17/2012  . Hyperlipidemia 06/17/2012   Past Medical History:  Diagnosis Date  . Allergy   . Anxiety   . Depression   . Diabetes mellitus without complication (HCC)   . Hyperlipidemia   . Hypertension    No family history on file. No past surgical history on file. Social History   Occupational History  . Not on file.   Social History Main Topics  . Smoking status: Never Smoker  . Smokeless tobacco: Never Used  . Alcohol use No  . Drug use: Unknown  . Sexual activity: Not on file    OBJECTIVE:  VS:  HT:5\' 4"  (162.6 cm)   WT:183 lb 12.8 oz (83.4  kg)  BMI:31.6    BP:128/70  HR:77bpm  TEMP: ( )  RESP:98 % EXAM: Findings:  WDWN, NAD, Non-toxic appearing Alert. Depressed and anxious with poor memory and poor recall. No increased work of breathing. Pupils are equal. EOM intact without nystagmus No clubbing or cyanosis of the extremities appreciated No  significant rashes/lesions/ulcerations overlying the examined area. Radial pulses 2+/4.  No significant generalized UE edema. DP & PT pulses 2+/4.  No significant pretibial edema.  No clubbing or cyanosis Sensation is altered to light touch with hyperesthesia throughout both upper and lower extremities.  Neck and shoulders She has marked pain with Bilateral brachial plexus squeeze and arm squeeze test but is most focally tender with bilateral Hawkins and Neer's. Rotator cuff strength is intact.  Upper extremity myotome strength is intact Negative Hoffman's  Back and legs: Markedly tight straight leg raises.  She has pain with any type of palpation of the lower extremities.  Poor lumbar mobility.     No results found. ASSESSMENT & PLAN:   Problem List Items Addressed This Visit    Bipolar 2 disorder (HCC)   Fibromyalgia    Patient has had significant worsening in her pain, tearfulness and depression.  She has been on a fairly low-dose of Cymbalta for quite some time and would likely benefit from an upper titration.  I would like for her to try 30 mg tablets and prescription for this has been sent in today.  Dr. Sandria Manly is her primary psychiatrist and I would like to make sure that he is okay with this change however Kaitlin Salazar the patient's daughter and patient herself agreed that this change may be helpful.  Additionally comprehensive care for the elderly through the PACE program may be an option for Kaitlin Salazar.  I believe that she do quite well in a group setting and given the worsening mobility status as well as worsening anxiety depression believe he should look into the program as an option.      Type 2 diabetes mellitus without complication, without long-term current use of insulin (HCC)    Watch sugars with shoulder injections today      Knee osteoarthritis   Osteoarthritis of spine with radiculopathy, cervical region   Relevant Medications   DULoxetine (CYMBALTA) 30 MG capsule    Osteoarthritis of spine with radiculopathy, lumbar region - Primary    See prior notes but neurosurgical consultation is needed for their opinion.  There are severe changes on her prior MRI. Neurosurgeries number has been given to [place] her daughter who will help coordinate with this.      Relevant Medications   DULoxetine (CYMBALTA) 30 MG capsule   Shoulder pain, bilateral    Persistent ongoing shoulder pain with positive impingement signs today.  Is also likely some cervical spine contribution to this and was still appreciated [facility]'s input on both her lumbar spine and cervical spine changes.  ++++++++++++++++++++++++++++++++++++++++++++ PROCEDURE NOTE: Bilateral Subacromial Injections   DESCRIPTION OF PROCEDURE:  The patient's clinical condition is marked by substantial pain and/or significant functional disability. Other conservative therapy has not provided relief, is contraindicated, or not appropriate. There is a reasonable likelihood that injection will significantly improve the patient's pain and/or functional impairment. After discussing the risks, benefits and expected outcomes of the injection and all questions were reviewed and answered, the patient wished to undergo the above named procedure. Verbal consent was obtained. The target structure was injected under direct visualization using sterile technique as below:  RIGHT PREP: Alcohol,  Ethel Chloride APPROACH: Posterior, Single Injection, 22g 1.5" needle INJECTATE: 3 cc 1% lidocaine, 1 cc 40mg  DepoMedrol DRESSING: Band-Aid  LEFT: PREP: Alcohol, Ethel Chloride APPROACH: Posterior, Single Injection, 22g 1.5" needle INJECTATE: 3 cc 1% lidocaine, 1 cc 40mg  DepoMedrol DRESSING: Band-Aid  Post procedural instructions including recommending icing and warning signs for infection were reviewed. This procedure was well tolerated and there were no complications.        Memory loss    Kaitlin Salazar is having a fairly  severe worsening of  memory and is actually unable to recall anything from our last office visit including the need for consultation with neurosurgery given the extensive changes on the MRI of her lumbar spine.  Recommend formal evaluation by psych and/or PCP with consideration of Aricept and/or Namenda if appropriate.  Kaitlin Salazar her daughter voices understanding and will help facilitate this.         Follow-up: Return if symptoms worsen or fail to improve.   CMA/ATC served as Neurosurgeon during this visit. History, Physical, and Plan performed by medical provider. Documentation and orders reviewed and attested to.      Gaspar Bidding, DO    [facility] Physician

## 2016-06-11 NOTE — Patient Instructions (Signed)
Recommend considering looking into the pace program in [place].  He should be eligible since you are a resident of [facility].  Information regarding this can be found online or you can call [phone].  Please call to schedule a follow-up with the neurosurgeons to discuss options with him regarding the findings on her last MRI.  I would also like for you to try to increase her Cymbalta to 30 mg.  I have sent a prescription into your pharmacy today.

## 2016-06-14 DIAGNOSIS — R413 Other amnesia: Secondary | ICD-10-CM | POA: Insufficient documentation

## 2016-06-14 NOTE — Assessment & Plan Note (Signed)
Watch sugars with shoulder injections today

## 2016-06-14 NOTE — Assessment & Plan Note (Signed)
Patient has had significant worsening in her pain, tearfulness and depression.  She has been on a fairly low-dose of Cymbalta for quite some time and would likely benefit from an upper titration.  I would like for her to try 30 mg tablets and prescription for this has been sent in today.  Dr. Sandria Manly is her primary psychiatrist and I would like to make sure that he is okay with this change however Tresa Endo the patient's daughter and patient herself agreed that this change may be helpful.  Additionally comprehensive care for the elderly through the PACE program may be an option for [place].  I believe that she do quite well in a group setting and given the worsening mobility status as well as worsening anxiety depression believe he should look into the program as an option.

## 2016-06-14 NOTE — Assessment & Plan Note (Signed)
Persistent ongoing shoulder pain with positive impingement signs today.  Is also likely some cervical spine contribution to this and was still appreciated [facility]'s input on both her lumbar spine and cervical spine changes.  ++++++++++++++++++++++++++++++++++++++++++++ PROCEDURE NOTE: Bilateral Subacromial Injections   DESCRIPTION OF PROCEDURE:  The patient's clinical condition is marked by substantial pain and/or significant functional disability. Other conservative therapy has not provided relief, is contraindicated, or not appropriate. There is a reasonable likelihood that injection will significantly improve the patient's pain and/or functional impairment. After discussing the risks, benefits and expected outcomes of the injection and all questions were reviewed and answered, the patient wished to undergo the above named procedure. Verbal consent was obtained. The target structure was injected under direct visualization using sterile technique as below:  RIGHT PREP: Alcohol, Ethel Chloride APPROACH: Posterior, Single Injection, 22g 1.5" needle INJECTATE: 3 cc 1% lidocaine, 1 cc 40mg  DepoMedrol DRESSING: Band-Aid  LEFT: PREP: Alcohol, Ethel Chloride APPROACH: Posterior, Single Injection, 22g 1.5" needle INJECTATE: 3 cc 1% lidocaine, 1 cc 40mg  DepoMedrol DRESSING: Band-Aid  Post procedural instructions including recommending icing and warning signs for infection were reviewed. This procedure was well tolerated and there were no complications.

## 2016-06-14 NOTE — Assessment & Plan Note (Signed)
See prior notes but neurosurgical consultation is needed for their opinion.  There are severe changes on her prior MRI. Neurosurgeries number has been given to [place] her daughter who will help coordinate with this.

## 2016-06-14 NOTE — Assessment & Plan Note (Signed)
Kaitlin Salazar is having a fairly severe worsening of  memory and is actually unable to recall anything from our last office visit including the need for consultation with neurosurgery given the extensive changes on the MRI of her lumbar spine.  Recommend formal evaluation by psych and/or PCP with consideration of Aricept and/or Namenda if appropriate.  Tresa Endo her daughter voices understanding and will help facilitate this.

## 2016-07-22 ENCOUNTER — Telehealth: Payer: Self-pay | Admitting: Sports Medicine

## 2016-07-22 NOTE — Telephone Encounter (Signed)
Pt states July 24 appt with [facility]. They just need the paper referral faxed again per pt they are unable to locate the original referral.

## 2016-07-31 ENCOUNTER — Other Ambulatory Visit: Payer: Self-pay | Admitting: Sports Medicine

## 2016-08-04 ENCOUNTER — Ambulatory Visit: Payer: Medicare Other | Admitting: Sports Medicine

## 2016-08-06 ENCOUNTER — Ambulatory Visit: Payer: Medicare Other | Admitting: Sports Medicine

## 2016-08-11 ENCOUNTER — Other Ambulatory Visit: Payer: Self-pay | Admitting: Sports Medicine

## 2016-08-24 ENCOUNTER — Ambulatory Visit: Payer: Medicare Other | Admitting: Sports Medicine

## 2016-08-25 ENCOUNTER — Encounter: Payer: Self-pay | Admitting: Sports Medicine

## 2016-08-25 ENCOUNTER — Ambulatory Visit (INDEPENDENT_AMBULATORY_CARE_PROVIDER_SITE_OTHER): Payer: Medicare Other | Admitting: Sports Medicine

## 2016-08-25 VITALS — BP 118/72 | HR 74 | Ht 64.0 in | Wt 181.8 lb

## 2016-08-25 DIAGNOSIS — M797 Fibromyalgia: Secondary | ICD-10-CM

## 2016-08-25 DIAGNOSIS — M4726 Other spondylosis with radiculopathy, lumbar region: Secondary | ICD-10-CM | POA: Diagnosis not present

## 2016-08-25 DIAGNOSIS — M25512 Pain in left shoulder: Secondary | ICD-10-CM | POA: Diagnosis not present

## 2016-08-25 DIAGNOSIS — M4722 Other spondylosis with radiculopathy, cervical region: Secondary | ICD-10-CM

## 2016-08-25 DIAGNOSIS — M1712 Unilateral primary osteoarthritis, left knee: Secondary | ICD-10-CM

## 2016-08-25 DIAGNOSIS — R413 Other amnesia: Secondary | ICD-10-CM

## 2016-08-25 DIAGNOSIS — G8929 Other chronic pain: Secondary | ICD-10-CM

## 2016-08-25 DIAGNOSIS — M25511 Pain in right shoulder: Secondary | ICD-10-CM | POA: Diagnosis not present

## 2016-08-25 NOTE — Patient Instructions (Signed)

## 2016-08-25 NOTE — Progress Notes (Signed)
OFFICE VISIT NOTE Kaitlin Salazar. Kaitlin Salazar Sports Medicine [facility] at [facility] [phone]  Kaitlin Salazar - 76 y.o. female MRN [id number]  Date of birth: 06-Feb-1940  Visit Date: 08/25/2016  PCP: Kaitlin Salazar., MD   Referred by: Kaitlin Salazar., MD  Orlie Dakin, CMA acting as scribe for Dr. Berline Chough.  SUBJECTIVE:   Chief Complaint  Patient presents with  . Follow-up    multiple aches and pains   HPI: As below and per problem based documentation when appropriate.  Kaitlin Salazar is an established patient presenting today with complaint of multiple aches and pain. She feels pain from head to toe. Pain seems to stay relatively constant, not much change since she was last seen. She c/o bilateral knee pain, L>R. She feels a popping and rubbing sensation in the left knee. She c/o bilateral leg pain. She was seen by her neurosurgeon and says that she thinks they referred her out for an epidural injection in her spine. She c/o bilateral shoulder pain and neck pain.     Review of Systems  Constitutional: Positive for chills. Negative for fever.  Cardiovascular: Positive for leg swelling.  Musculoskeletal: Positive for joint pain, myalgias and neck pain.    Otherwise per HPI.  HISTORY & PERTINENT PRIOR DATA:  No specialty comments available. She reports that she has never smoked. She has never used smokeless tobacco. No results for input(s): HGBA1C, LABURIC in the last 8760 hours. Medications & Allergies reviewed per EMR Patient Active Problem List   Diagnosis Date Noted  . Memory loss 06/14/2016  . Shoulder pain, bilateral 06/11/2016  . Knee osteoarthritis 03/17/2016  . Osteoarthritis of spine with radiculopathy, cervical region 03/17/2016  . Osteoarthritis of spine with radiculopathy, lumbar region 03/17/2016  . Thrombocytopenia (HCC) 02/12/2016  . PVC (premature ventricular contraction) 01/20/2016  . Controlled type 2 diabetes mellitus without  complication, without long-term current use of insulin (HCC) 04/20/2015  . Benign essential HTN 02/03/2015  . Intertrigo 02/03/2015  . Type 2 diabetes mellitus without complication, without long-term current use of insulin (HCC) 02/03/2015  . Idiopathic cardiomyopathy (HCC) 11/26/2014  . Generalized anxiety disorder 04/29/2014  . Bipolar 2 disorder (HCC) 05/22/2013  . Esophageal reflux 06/17/2012  . Essential hypertension 06/17/2012  . Fibromyalgia 06/17/2012  . Hypercholesterolemia 06/17/2012  . Hyperlipidemia 06/17/2012   Past Medical History:  Diagnosis Date  . Allergy   . Anxiety   . Depression   . Diabetes mellitus without complication (HCC)   . Hyperlipidemia   . Hypertension    No family history on file. No past surgical history on file. Social History   Occupational History  . Not on file.   Social History Main Topics  . Smoking status: Never Smoker  . Smokeless tobacco: Never Used  . Alcohol use No  . Drug use: Unknown  . Sexual activity: Not on file    OBJECTIVE:  VS:  HT:5\' 4"  (162.6 cm)   WT:181 lb 12.8 oz (82.5 kg)  BMI:31.3    BP:118/72  HR:74bpm  TEMP: ( )  RESP:94 % EXAM: Findings:  Adult female.  Poor insight.  Walks with a cane.  Her   executive functioning does seem to be significantly worse than it had been in the remote past.  She has been started on Namenda since my last visit this does seem to have helped.  Bilateral upper extremities are overall normal appearing no significant atrophy.  She has marked pain with right greater  than left Hawkins testing.  Her bilateral knees are overall well aligned but significant osteoarthritic bossing and generalized synovitis.  No effusion.  Her DP PT pulses are palpable.  Her upper extremity strength and sensation is intact.  She does have pain with straight leg raise bilaterally   Left worse than right.     No results found. ASSESSMENT & PLAN:     ICD-10-CM   1. Memory loss R41.3   2. Chronic pain of  both shoulders M25.511    G89.29    M25.512   3. Osteoarthritis of spine with radiculopathy, lumbar region M47.26   4. Osteoarthritis of spine with radiculopathy, cervical region M47.22   5. Primary osteoarthritis of left knee M17.12   6. Fibromyalgia M79.7   ================================================================= Knee osteoarthritis Left knee injection performed today as below.  PROCEDURE NOTE: Left Knee Injection   DESCRIPTION OF PROCEDURE:  The patient's clinical condition is marked by substantial pain and/or significant functional disability. Other conservative therapy has not provided relief, is contraindicated, or not appropriate. There is a reasonable likelihood that injection will significantly improve the patient's pain and/or functional impairment. After discussing the risks, benefits and expected outcomes of the injection and all questions were reviewed and answered, the patient wished to undergo the above named procedure. Verbal consent was obtained. The target structure was injected under direct visualization using sterile technique as below: PREP: Alcohol, Ethel Chloride APPROACH: Anteriolateral ,Single Injection, 22g 1.5" needle INJECTATE: 2cc 1% lidocaine, 1 cc 40mg  DepoMedrol DRESSING: Band-Aid  Post procedural instructions including recommending icing and warning signs for infection were reviewed. This procedure was well tolerated and there were no complications.      Osteoarthritis of spine with radiculopathy, lumbar region She has been seen by neurosurgery has recommended a different epidural.  We do not have these records available but this time agreed in trying to avoid surgery at all cost is appropriate for her pain does continue to be severe.  She does not do quite well with the for her knee and her shoulders and she may actually be a good candidate for injections intermittently daily corticosteroid use this will be deferred at this time.   Memory  loss She does appear to be slightly better on Namenda and her daughter has noticed some improvement as well.  Shoulder pain, bilateral Right greater than left shoulder pain today.  Subacromial injection performed as below.  Still suspect some of her issues are likely coming from the cervical etiology.  PROCEDURE NOTE: Right Shoulder Injection   DESCRIPTION OF PROCEDURE:  The patient's clinical condition is marked by substantial pain and/or significant functional disability. Other conservative therapy has not provided relief, is contraindicated, or not appropriate. There is a reasonable likelihood that injection will significantly improve the patient's pain and/or functional impairment. After discussing the risks, benefits and expected outcomes of the injection and all questions were reviewed and answered, the patient wished to undergo the above named procedure. Verbal consent was obtained. The target structure was injected under direct visualization using sterile technique as below: PREP: Alcohol, Ethel Chloride APPROACH: posterior,single injection, 22g 1.5" needle INJECTATE: 2cc 1% lidocaine, 1cc 40mg  DepoMedrol DRESSING: Band-Aid  Post procedural instructions including recommending icing and warning signs for infection were reviewed. This procedure was well tolerated and there were no complications.     =================================================================  Follow-up: Return in about 10 weeks (around 11/03/2016).   CMA/ATC served as Neurosurgeon during this visit. History, Physical, and Plan performed by medical provider. Documentation and orders  reviewed and attested to.      Teresa Coombs, [place] [facility] Physician

## 2016-08-27 MED ORDER — GABAPENTIN 100 MG PO CAPS: 100.0000 mg | ORAL_CAPSULE | Freq: Two times a day (BID) | ORAL | 1 refills | Status: DC

## 2016-08-27 NOTE — Assessment & Plan Note (Signed)
She has been seen by neurosurgery has recommended a different epidural.  We do not have these records available but this time agreed in trying to avoid surgery at all cost is appropriate for her pain does continue to be severe.  She does not do quite well with the for her knee and her shoulders and she may actually be a good candidate for injections intermittently daily corticosteroid use this will be deferred at this time.

## 2016-08-27 NOTE — Assessment & Plan Note (Signed)
Right greater than left shoulder pain today.  Subacromial injection performed as below.  Still suspect some of her issues are likely coming from the cervical etiology.  PROCEDURE NOTE: Right Shoulder Injection   DESCRIPTION OF PROCEDURE:  The patient's clinical condition is marked by substantial pain and/or significant functional disability. Other conservative therapy has not provided relief, is contraindicated, or not appropriate. There is a reasonable likelihood that injection will significantly improve the patient's pain and/or functional impairment. After discussing the risks, benefits and expected outcomes of the injection and all questions were reviewed and answered, the patient wished to undergo the above named procedure. Verbal consent was obtained. The target structure was injected under direct visualization using sterile technique as below: PREP: Alcohol, Ethel Chloride APPROACH: posterior,single injection, 22g 1.5" needle INJECTATE: 2cc 1% lidocaine, 1cc 40mg  DepoMedrol DRESSING: Band-Aid  Post procedural instructions including recommending icing and warning signs for infection were reviewed. This procedure was well tolerated and there were no complications.

## 2016-08-27 NOTE — Assessment & Plan Note (Signed)
Left knee injection performed today as below.  PROCEDURE NOTE: Left Knee Injection   DESCRIPTION OF PROCEDURE:  The patient's clinical condition is marked by substantial pain and/or significant functional disability. Other conservative therapy has not provided relief, is contraindicated, or not appropriate. There is a reasonable likelihood that injection will significantly improve the patient's pain and/or functional impairment. After discussing the risks, benefits and expected outcomes of the injection and all questions were reviewed and answered, the patient wished to undergo the above named procedure. Verbal consent was obtained. The target structure was injected under direct visualization using sterile technique as below: PREP: Alcohol, Ethel Chloride APPROACH: Anteriolateral ,Single Injection, 22g 1.5" needle INJECTATE: 2cc 1% lidocaine, 1 cc 40mg  DepoMedrol DRESSING: Band-Aid  Post procedural instructions including recommending icing and warning signs for infection were reviewed. This procedure was well tolerated and there were no complications.

## 2016-08-27 NOTE — Assessment & Plan Note (Signed)
She does appear to be slightly better on Namenda and her daughter has noticed some improvement as well.

## 2016-10-05 ENCOUNTER — Other Ambulatory Visit: Payer: Self-pay | Admitting: Sports Medicine

## 2016-10-22 ENCOUNTER — Other Ambulatory Visit: Payer: Self-pay

## 2016-10-22 ENCOUNTER — Telehealth: Payer: Self-pay | Admitting: Sports Medicine

## 2016-10-22 MED ORDER — TRAMADOL HCL 50 MG PO TABS: ORAL_TABLET | ORAL | 1 refills | Status: DC

## 2016-10-22 NOTE — Telephone Encounter (Signed)
Called pt, no answer no VM.

## 2016-10-22 NOTE — Telephone Encounter (Signed)
Rx called in to [facility]. 

## 2016-10-22 NOTE — Telephone Encounter (Signed)
MEDICATION: traMADol (ULTRAM) 50 MG tablet  PHARMACY:   [id number] - [place], [place] - [address] [phone] (Phone) [phone] (Fax)     IS THIS A 90 DAY SUPPLY : no  IS PATIENT OUT OF MEDICATION: yes  IF NOT; HOW MUCH IS LEFT:   LAST APPOINTMENT DATE: @081518   NEXT APPOINTMENT DATE:@10 /24/2018  OTHER COMMENTS:    **Let patient know to contact pharmacy at the end of the day to make sure medication is ready. **  ** Please notify patient to allow 48-72 hours to process**  **Encourage patient to contact the pharmacy for refills or they can request refills through [facility]**

## 2016-11-03 ENCOUNTER — Ambulatory Visit: Payer: Medicare Other | Admitting: Sports Medicine

## 2016-11-25 ENCOUNTER — Other Ambulatory Visit: Payer: Self-pay | Admitting: Sports Medicine

## 2016-12-02 ENCOUNTER — Other Ambulatory Visit: Payer: Self-pay | Admitting: Sports Medicine

## 2017-02-03 ENCOUNTER — Other Ambulatory Visit: Payer: Self-pay | Admitting: Sports Medicine

## 2017-02-16 DIAGNOSIS — I420 Dilated cardiomyopathy: Secondary | ICD-10-CM | POA: Insufficient documentation

## 2017-03-31 ENCOUNTER — Other Ambulatory Visit: Payer: Self-pay | Admitting: Sports Medicine

## 2017-03-31 MED ORDER — TRAMADOL HCL 50 MG PO TABS: ORAL_TABLET | ORAL | 0 refills | Status: DC

## 2017-03-31 NOTE — Telephone Encounter (Signed)
Copied from CRM [id number]. Topic: Inquiry >> Mar 31, 2017 10:54 AM Yvonna Alanis wrote: Reason for CRM: Patient's daughter requests a refill of traMADol (ULTRAM) 50 MG tablet for the patient. Patient's daughter has contacted the pharmacy. Patient's preferred pharmacy is [id number] - [place], [place] - [address] [phone] (Phone)     [phone] (Fax).       Thank You!!!

## 2017-03-31 NOTE — Telephone Encounter (Signed)
#  60 sent to pharmacy. Will need OV for any further Rx a

## 2017-03-31 NOTE — Telephone Encounter (Signed)
Last OV 08/25/16, last refill 10/22/2016 #120/1 refill.

## 2017-04-06 ENCOUNTER — Ambulatory Visit (INDEPENDENT_AMBULATORY_CARE_PROVIDER_SITE_OTHER): Payer: Medicare HMO | Admitting: Sports Medicine

## 2017-04-06 ENCOUNTER — Encounter: Payer: Self-pay | Admitting: Sports Medicine

## 2017-04-06 ENCOUNTER — Ambulatory Visit: Payer: Self-pay

## 2017-04-06 ENCOUNTER — Ambulatory Visit (INDEPENDENT_AMBULATORY_CARE_PROVIDER_SITE_OTHER): Payer: Medicare HMO

## 2017-04-06 VITALS — BP 124/72 | HR 67 | Ht 64.0 in | Wt 181.6 lb

## 2017-04-06 DIAGNOSIS — M4726 Other spondylosis with radiculopathy, lumbar region: Secondary | ICD-10-CM | POA: Diagnosis not present

## 2017-04-06 DIAGNOSIS — M1712 Unilateral primary osteoarthritis, left knee: Secondary | ICD-10-CM

## 2017-04-06 DIAGNOSIS — M25552 Pain in left hip: Secondary | ICD-10-CM

## 2017-04-06 DIAGNOSIS — M797 Fibromyalgia: Secondary | ICD-10-CM

## 2017-04-06 DIAGNOSIS — M25511 Pain in right shoulder: Secondary | ICD-10-CM | POA: Diagnosis not present

## 2017-04-06 DIAGNOSIS — M25512 Pain in left shoulder: Secondary | ICD-10-CM

## 2017-04-06 DIAGNOSIS — G8929 Other chronic pain: Secondary | ICD-10-CM

## 2017-04-06 DIAGNOSIS — M4722 Other spondylosis with radiculopathy, cervical region: Secondary | ICD-10-CM | POA: Diagnosis not present

## 2017-04-06 NOTE — Procedures (Signed)
X-Rays obtained at [facility] Interpreted by myself [name], DO) during office visit.  Results were reviewed with the patient at the time of the visit.   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

## 2017-04-06 NOTE — Patient Instructions (Signed)

## 2017-04-06 NOTE — Progress Notes (Signed)
Kaitlin Salazar. Kaitlin Salazar Sports Medicine [facility] at [facility] [phone]  [name] - 77 y.o. female MRN [id number]  Date of birth: 06-13-1940  Visit Date: 04/06/2017  PCP: Lyndel Safe., MD   Referred by: Lyndel Safe., MD  Scribe for today's visit: Stevenson Clinch, CMA     SUBJECTIVE:  Kaitlin Salazar is here for Follow-up (hip pain, knee pain, neck pain)  08/25/16; Kaitlin Salazar is an established patient presenting today with complaint of multiple aches and pain. She feels pain from head to toe. Pain seems to stay relatively constant, not much change since she was last seen. She c/o bilateral knee pain, L>R. She feels a popping and rubbing sensation in the left knee. She c/o bilateral leg pain. She was seen by her neurosurgeon and says that she thinks they referred her out for an epidural injection in her spine. She c/o bilateral shoulder pain and neck pain.   04/06/2017 Compared to the last office visit, her previously described symptoms are worsening.  She has pain in both knees, Her L knee seems to be giving her the most pain. She has swelling in both legs. She has a lot of pain in LLE when walking. She is not having a hard time when walking. She ambulates with a cane but is in a wheelchair today.  Neck pain radiates into both arms and shoulders.  lower back.   Current symptoms are moderate & are radiating to extremities.  She has been taking Tramadol and Tylenol with minimal relief. She has also been taking Gabapentin with no side effects.  ROS Reports night time disturbances. Denies fevers, chills, or night sweats. Denies unexplained weight loss. Denies personal history of cancer. Denies changes in bowel or bladder habits. Denies recent unreported falls. Reports new or worsening dyspnea or wheezing. Reports headaches or dizziness.  Reports numbness, tingling or weakness in the extremities.  Reports dizziness or presyncopal  episodes Reports lower extremity edema    HISTORY & PERTINENT PRIOR DATA:  Prior History reviewed and updated per electronic medical record.  Significant/pertinent history, findings, studies include:  reports that she has never smoked. She has never used smokeless tobacco. No results for input(s): HGBA1C, LABURIC, CREATINE in the last 8760 hours. No specialty comments available. No problems updated.  OBJECTIVE:  VS:  HT:5\' 4"  (162.6 cm)   WT:181 lb 9.6 oz (82.4 kg)  BMI:31.16    BP:124/72  HR:67bpm  TEMP: ( )  RESP:96 %   PHYSICAL EXAM: Constitutional: WDWN, Non-toxic appearing. Psychiatric: Alert & appropriately interactive.  Not depressed or anxious appearing. Respiratory: No increased work of breathing.  Trachea Midline Eyes: Pupils are equal.  EOM intact without nystagmus.  No scleral icterus  Vascular Exam: warm to touch no edema  upper and lower extremity neuro exam: unremarkable normal strength Hyperesthesia diffusely including FM tenderpoints  MSK Exam: Tenderness palpation diffusely.  She has moderate degree of pain with Stinchfield testing and multiple testing on the left.  Knee flexion and extension is slightly limited with no effusion appreciated today.   ASSESSMENT & PLAN:   1. Chronic pain of both shoulders   2. Osteoarthritis of spine with radiculopathy, lumbar region   3. Osteoarthritis of spine with radiculopathy, cervical region   4. Primary osteoarthritis of left knee   5. Fibromyalgia   6. Pain of left hip joint     PLAN: Injection left hip today for both diagnostic and therapeutic purposes.  Leg pain that she  relates to having her knee give out on her may be coming from her hip given the degree of change appreciated on x-ray today.  If any lack of improvement can consider injection in the knee but this is been mildly beneficial next  She is continuing to have fairly significant cognitive decline safety and awareness is an issue.  Not interested  in physical therapy but this could addressed again in the future.  Follow-up: Return in about 6 weeks (around 05/18/2017).      Please see additional documentation for Objective, Assessment and Plan sections. Pertinent additional documentation may be included in corresponding procedure notes, imaging studies, problem based documentation and patient instructions. Please see these sections of the encounter for additional information regarding this visit.  CMA/ATC served as Neurosurgeon during this visit. History, Physical, and Plan performed by medical provider. Documentation and orders reviewed and attested to.      [name], DO    [place] [facility] Physician

## 2017-04-09 NOTE — Procedures (Signed)
PROCEDURE NOTE:  Ultrasound Guided: Injection: Left hip Images were obtained and interpreted by myself, Gaspar Bidding, DO  Images have been saved and stored to PACS system. Images obtained on: GE S7 Ultrasound machine    ULTRASOUND FINDINGS:  Marginal osteophytes with small joint effusion.  DESCRIPTION OF PROCEDURE:  The patient's clinical condition is marked by substantial pain and/or significant functional disability. Other conservative therapy has not provided relief, is contraindicated, or not appropriate. There is a reasonable likelihood that injection will significantly improve the patient's pain and/or functional impairment.   After discussing the risks, benefits and expected outcomes of the injection and all questions were reviewed and answered, the patient wished to undergo the above named procedure.  Verbal consent was obtained.  The ultrasound was used to identify the target structure and adjacent neurovascular structures. The skin was then prepped in sterile fashion and the target structure was injected under direct visualization using sterile technique as below:  PREP: Alcohol and Ethel Chloride APPROACH: direct, stopcock technique, 22g 3.5 in. INJECTATE: 5 cc 1% lidocaine, 2 cc 0.5% Marcaine and 2 cc 40mg /mL DepoMedrol ASPIRATE: None DRESSING: Band-Aid  Post procedural instructions including recommending icing and warning signs for infection were reviewed.    This procedure was well tolerated and there were no complications.   IMPRESSION: Succesful Ultrasound Guided: Injection

## 2017-05-18 ENCOUNTER — Ambulatory Visit: Payer: Medicare HMO | Admitting: Sports Medicine

## 2017-05-25 ENCOUNTER — Ambulatory Visit: Payer: Medicare HMO | Admitting: Sports Medicine

## 2017-05-25 ENCOUNTER — Encounter: Payer: Self-pay | Admitting: Sports Medicine

## 2017-05-25 VITALS — BP 122/82 | HR 87 | Ht 64.0 in | Wt 180.6 lb

## 2017-05-25 DIAGNOSIS — M25512 Pain in left shoulder: Secondary | ICD-10-CM | POA: Diagnosis not present

## 2017-05-25 DIAGNOSIS — R413 Other amnesia: Secondary | ICD-10-CM | POA: Diagnosis not present

## 2017-05-25 DIAGNOSIS — M4722 Other spondylosis with radiculopathy, cervical region: Secondary | ICD-10-CM | POA: Diagnosis not present

## 2017-05-25 DIAGNOSIS — G8929 Other chronic pain: Secondary | ICD-10-CM

## 2017-05-25 DIAGNOSIS — M797 Fibromyalgia: Secondary | ICD-10-CM | POA: Diagnosis not present

## 2017-05-25 DIAGNOSIS — M25511 Pain in right shoulder: Secondary | ICD-10-CM

## 2017-05-25 DIAGNOSIS — M4726 Other spondylosis with radiculopathy, lumbar region: Secondary | ICD-10-CM

## 2017-05-25 DIAGNOSIS — M1712 Unilateral primary osteoarthritis, left knee: Secondary | ICD-10-CM | POA: Diagnosis not present

## 2017-05-25 MED ORDER — GABAPENTIN 300 MG PO CAPS: 300.0000 mg | ORAL_CAPSULE | Freq: Every day | ORAL | 3 refills | Status: DC

## 2017-05-25 MED ORDER — GABAPENTIN 100 MG PO CAPS: 100.0000 mg | ORAL_CAPSULE | Freq: Two times a day (BID) | ORAL | 1 refills | Status: DC

## 2017-05-25 MED ORDER — TRAMADOL HCL 50 MG PO TABS: ORAL_TABLET | ORAL | 0 refills | Status: DC

## 2017-05-25 NOTE — Patient Instructions (Signed)

## 2017-05-25 NOTE — Progress Notes (Signed)
Kaitlin Salazar. [facility] at [facility] [phone]  Kaitlin Salazar - 77 y.o. female MRN [id number]  Date of birth: 04/21/1940  Visit Date: 05/25/2017  PCP: Lyndel Safe., MD   Referred by: Lyndel Safe., MD  Scribe for today's visit: Christoper Fabian, LAT, ATC     SUBJECTIVE:  Kaitlin Salazar is here for Follow-up (Back, neck, B knee and B shoulder pain) .   08/25/16; Kaitlin Salazar is an established patient presenting today with complaint of multiple aches and pain. She feels pain from head to toe. Pain seems to stay relatively constant, not much change since she was last seen. She c/o bilateral knee pain, L>R. She feels a popping and rubbing sensation in the left knee. She c/o bilateral leg pain. She was seen by her neurosurgeon and says that she thinks they referred her out for an epidural injection in her spine. She c/o bilateral shoulder pain and neck pain.   04/06/2017 Compared to the last office visit, her previously described symptoms are worsening.  She has pain in both knees, Her L knee seems to be giving her the most pain. She has swelling in both legs. She has a lot of pain in LLE when walking. She is not having a hard time when walking. She ambulates with a cane but is in a wheelchair today.  Neck pain radiates into both arms and shoulders.  lower back.   Current symptoms are moderate & are radiating to extremities.  She has been taking Tramadol and Tylenol with minimal relief. She has also been taking Gabapentin with no side effects.  05/25/2017: Compared to the last office visit on 04/06/17, her previously described symptoms show no change. She has bilateral knee pain but L knee has been flaring up more recently. She has been ambulating with a cane and still has trouble walking.  Current symptoms are severe and radiate from the neck into the shoulders and arms. Also radiates from the lower back unto both legs.  She has been  taking Tramadol, Tylenol and Gabapentin.  She had a L hip injection at her last visit and B hip x-rays. Her biggest areas of concern today are the L knee and her neck.   ROS Reports night time disturbances. Denies fevers, chills, or night sweats. Denies unexplained weight loss. Denies personal history of cancer. Denies changes in bowel or bladder habits. Denies recent unreported falls. Denies new or worsening dyspnea or wheezing. Reports headaches or dizziness.  Reports numbness, tingling or weakness  In the extremities.  Reports dizziness or presyncopal episodes Reports lower extremity edema    HISTORY & PERTINENT PRIOR DATA:  Prior History reviewed and updated per electronic medical record.  Significant/pertinent history, findings, studies include:  reports that she has never smoked. She has never used smokeless tobacco. No results for input(s): HGBA1C, LABURIC, CREATINE in the last 8760 hours. No specialty comments available. No problems updated.  OBJECTIVE:  VS:  HT:5\' 4"  (162.6 cm)   WT:180 lb 9.6 oz (81.9 kg)  BMI:30.98    BP:122/82  HR:87bpm  TEMP: ( )  RESP:96 %   PHYSICAL EXAM: Constitutional: WDWN, Non-toxic appearing. Psychiatric: Alert & appropriately interactive.  Not depressed or anxious appearing. Respiratory: No increased work of breathing.  Trachea Midline Eyes: Pupils are equal.  EOM intact without nystagmus.  No scleral icterus  Vascular Exam: warm to touch no edema  upper and lower extremity neuro exam: Hyper esthesia throughout the entire  upper and lower extremities diffusely without focality.  She has pain with brachial plexus squeeze greater on the right than on the left.  Painful Hawkins as well as Neer's test on the right, improved on the left.  She has pain to palpation of the medial joint line of the left knee.  Postsurgical incision of the right knee.  Moderate internal and external rotation bilateral hips.  She does walk with an antalgic gait  using a cane.   ASSESSMENT & PLAN:   1. Chronic pain of both shoulders   2. Osteoarthritis of spine with radiculopathy, lumbar region   3. Osteoarthritis of spine with radiculopathy, cervical region   4. Primary osteoarthritis of left knee   5. Fibromyalgia   6. Memory loss     PLAN: We will go ahead and inject her right shoulder and left knee today as this has provided her moderate benefit and improves her level of function.  She had minimal response to the intra-articular hip injection and we will defer any further intra-articular injections at this time.  She is having some diminishing cognitive issues we did discuss the importance of safety as well as living arrangements.  Refill on medications today including Ultram and gabapentin.  We did discuss that there is an increased fall risk of these but they do seem to be keeping her functioning.  She is not interested in surgical intervention at this time.   Follow-up: Return in about 6 weeks (around 07/06/2017).      Please see additional documentation for Objective, Assessment and Plan sections. Pertinent additional documentation may be included in corresponding procedure notes, imaging studies, problem based documentation and patient instructions. Please see these sections of the encounter for additional information regarding this visit.  CMA/ATC served as Neurosurgeon during this visit. History, Physical, and Plan performed by medical provider. Documentation and orders reviewed and attested to.      Kaitlin Mews, DO    [facility]

## 2017-05-25 NOTE — Progress Notes (Signed)
PROCEDURE NOTE:  [facility] Guided: Injection: Right shoulder  DESCRIPTION OF PROCEDURE:  The patient's clinical condition is marked by substantial pain and/or significant functional disability. Other conservative therapy has not provided relief, is contraindicated, or not appropriate. There is a reasonable likelihood that injection will significantly improve the patient's pain and/or functional impairment.  After discussing the risks, benefits and expected outcomes of the injection and all questions were reviewed and answered, the patient wished to undergo the above named procedure. Verbal consent was obtained. The skin was then prepped in sterile fashion and the target structure was injected as below: PREP: Alcohol and Ethel Chloride APPROACH: anteriomedial, single injection, 22g 1.5 in. INJECTATE: 2 cc 0.5% Marcaine and 1 cc 40mg /mL DepoMedrol ASPIRATE: None DRESSING: Band-Aid

## 2017-05-25 NOTE — Progress Notes (Signed)
PROCEDURE NOTE:  Landmark Guided: Injection: Left knee  DESCRIPTION OF PROCEDURE:  The patient's clinical condition is marked by substantial pain and/or significant functional disability. Other conservative therapy has not provided relief, is contraindicated, or not appropriate. There is a reasonable likelihood that injection will significantly improve the patient's pain and/or functional impairment.  After discussing the risks, benefits and expected outcomes of the injection and all questions were reviewed and answered, the patient wished to undergo the above named procedure. Verbal consent was obtained. The skin was then prepped in sterile fashion and the target structure was injected as below:  PREP: Alcohol and Ethel Chloride APPROACH: anteriomedial, single injection, 22g 1.5 in. INJECTATE: 2 cc 0.5% Marcaine and 1 cc 40mg /mL DepoMedrol ASPIRATE: None DRESSING: Band-Aid  Post procedural instructions including recommending icing and warning signs for infection were reviewed.    This procedure was well tolerated and there were no complications.

## 2017-07-06 ENCOUNTER — Ambulatory Visit: Payer: Medicare HMO | Admitting: Sports Medicine

## 2017-07-06 ENCOUNTER — Encounter: Payer: Self-pay | Admitting: Sports Medicine

## 2017-07-06 VITALS — BP 112/76 | HR 69 | Ht 64.0 in | Wt 175.2 lb

## 2017-07-06 DIAGNOSIS — M25511 Pain in right shoulder: Secondary | ICD-10-CM

## 2017-07-06 DIAGNOSIS — M4722 Other spondylosis with radiculopathy, cervical region: Secondary | ICD-10-CM

## 2017-07-06 DIAGNOSIS — M797 Fibromyalgia: Secondary | ICD-10-CM | POA: Diagnosis not present

## 2017-07-06 DIAGNOSIS — G8929 Other chronic pain: Secondary | ICD-10-CM

## 2017-07-06 DIAGNOSIS — M1712 Unilateral primary osteoarthritis, left knee: Secondary | ICD-10-CM

## 2017-07-06 DIAGNOSIS — M25512 Pain in left shoulder: Secondary | ICD-10-CM | POA: Diagnosis not present

## 2017-07-06 DIAGNOSIS — M4726 Other spondylosis with radiculopathy, lumbar region: Secondary | ICD-10-CM | POA: Diagnosis not present

## 2017-07-06 NOTE — Progress Notes (Signed)
[name]. [name] Sports Medicine [facility] at [facility] [phone]  Darnisha Krogman - 77 y.o. female MRN [id number]  Date of birth: 1940/09/22  Visit Date: 07/06/2017  PCP: Lyndel Safe., MD   Referred by: Lyndel Safe., MD  Scribe(s) for today's visit: Stevenson Clinch, CMA  SUBJECTIVE:  Saffiya Kobler is here for Follow-up (bilateral shoulder, neck, back, and L knee pain)   08/25/16; Chardonnae Lince is an established patient presenting today with complaint of multiple aches and pain. She feels pain from head to toe. Pain seems to stay relatively constant, not much change since she was last seen. She c/o bilateral knee pain, L>R. She feels a popping and rubbing sensation in the left knee. She c/o bilateral leg pain. She was seen by her neurosurgeon and says that she thinks they referred her out for an epidural injection in her spine. She c/o bilateral shoulder pain and neck pain.   04/06/2017 Compared to the last office visit, her previously described symptoms are worsening.  She has pain in both knees, Her L knee seems to be giving her the most pain. She has swelling in both legs. She has a lot of pain in LLE when walking. She is not having a hard time when walking. She ambulates with a cane but is in a wheelchair today.  Neck pain radiates into both arms and shoulders.  lower back.   Current symptoms are moderate & are radiating to extremities.  She has been taking Tramadol and Tylenol with minimal relief. She has also been taking Gabapentin with no side effects.  05/25/2017: Compared to the last office visit on 04/06/17, her previously described symptoms show no change. She has bilateral knee pain but L knee has been flaring up more recently. She has been ambulating with a cane and still has trouble walking.  Current symptoms are severe and radiate from the neck into the shoulders and arms. Also radiates from the lower back unto both legs.  She has  been taking Tramadol, Tylenol and Gabapentin.  She had a L hip injection at her last visit and B hip x-rays. Her biggest areas of concern today are the L knee and her neck.   07/06/2017: Compared to the last office visit, her previously described symptoms show no change. She has noticed mild improvement with L knee pain, but increased pain in the R knee.   She has noticed improvement with bilateral shoulder pain. She still has some pain but the injection she received at her last visit seemed to help.   She c/o bilateral rib pain, at times she pain makes her want to cry out. Pain seems to be sudden and she cannot think of what could be triggering the pain. She does have tenderness to palpation.   She c/o continues neck and back pain.     REVIEW OF SYSTEMS: Reports night time disturbances. Denies fevers, chills, or night sweats. Denies unexplained weight loss. Denies personal history of cancer. Denies changes in bowel or bladder habits. Denies recent unreported falls. Denies new or worsening dyspnea or wheezing. Reports headaches or dizziness.  Reports numbness, tingling or weakness  In the extremities.  Reports dizziness or presyncopal episodes Reports lower extremity edema    HISTORY & PERTINENT PRIOR DATA:  Prior History reviewed and updated per electronic medical record.  Significant/pertinent history, findings, studies include:  reports that she has never smoked. She has never used smokeless tobacco. No results for input(s): HGBA1C, LABURIC,  CREATINE in the last 8760 hours. No specialty comments available. No problems updated.  OBJECTIVE:  VS:  HT:5\' 4"  (162.6 cm)   WT:175 lb 3.2 oz (79.5 kg)  BMI:30.06    BP:112/76  HR:69bpm  TEMP: ( )  RESP:96 %   PHYSICAL EXAM: Constitutional: WDWN, Non-toxic appearing. Psychiatric: Alert & appropriately interactive.  Not depressed or anxious appearing. Respiratory: No increased work of breathing.  Trachea Midline Eyes: Pupils  are equal.  EOM intact without nystagmus.  No scleral icterus  Vascular Exam: warm to touch no edema  upper and lower extremity neuro exam: unremarkable  MSK Exam: Walks with a significant crouched forward antalgic gait wide-based stance.  She has pain with straight leg raise.  Bilateral knees have generalized osteoarthritic bossing and postsurgical midline incision on the right knee.  No significant effusion.   ASSESSMENT & PLAN:   1. Chronic pain of both shoulders   2. Osteoarthritis of spine with radiculopathy, lumbar region   3. Osteoarthritis of spine with radiculopathy, cervical region   4. Primary osteoarthritis of left knee   5. Fibromyalgia     PLAN: Multifaceted component of her pain.  The majority of her symptoms do seem to be coming from worsening leg pain however.  Is been quite a while since she is undergone epidural steroid injection and given the findings on her MRI she would likely benefit from a repeat injection in the spine.  Minimal upper extremity symptoms although this could be considered to have a cervical epidural some point in the future.  We will plan to check in with her progress in 8 weeks and overall she does seem to be doing slightly better following the last shoulder and left knee injection at last visit.  Follow-up: Return in about 8 weeks (around 08/31/2017).      Please see additional documentation for Objective, Assessment and Plan sections. Pertinent additional documentation may be included in corresponding procedure notes, imaging studies, problem based documentation and patient instructions. Please see these sections of the encounter for additional information regarding this visit.  CMA/ATC served as Neurosurgeon during this visit. History, Physical, and Plan performed by medical provider. Documentation and orders reviewed and attested to.      [name], DO    [facility] [facility]

## 2017-07-08 ENCOUNTER — Other Ambulatory Visit: Payer: Self-pay | Admitting: Sports Medicine

## 2017-07-18 ENCOUNTER — Other Ambulatory Visit: Payer: Self-pay | Admitting: Sports Medicine

## 2017-07-18 NOTE — Telephone Encounter (Signed)
See note

## 2017-07-18 NOTE — Telephone Encounter (Unsigned)
Copied from CRM [id number]. Topic: Quick Communication - Rx Refill/Question >> Jul 18, 2017  2:32 PM Gaynelle Adu wrote: Medication: traMADol (ULTRAM) 50 MG tablet gabapentin (NEURONTIN) 100 MG capsule  gabapentin (NEURONTIN) 500 MG capsule      Has the patient contacted their pharmacy: yes  Preferred Pharmacy (with phone number or street name):   [id number] - [place], [place] - [address]     [address] [place] [address]     Phone: [phone] Fax: [phone]

## 2017-07-19 ENCOUNTER — Other Ambulatory Visit: Payer: Self-pay

## 2017-07-19 MED ORDER — TRAMADOL HCL 50 MG PO TABS: ORAL_TABLET | ORAL | 0 refills | Status: DC

## 2017-07-19 MED ORDER — GABAPENTIN 300 MG PO CAPS: 300.0000 mg | ORAL_CAPSULE | Freq: Every day | ORAL | 1 refills | Status: DC

## 2017-07-19 MED ORDER — GABAPENTIN 100 MG PO CAPS: 100.0000 mg | ORAL_CAPSULE | Freq: Two times a day (BID) | ORAL | 1 refills | Status: AC

## 2017-07-19 NOTE — Telephone Encounter (Signed)
Gabapentin 100 mg and 300 mg refilled. Last OV 07/06/2017, last refill 05/25/2017 #60/0. OK to refill? Refill pending.

## 2017-08-04 ENCOUNTER — Encounter (INDEPENDENT_AMBULATORY_CARE_PROVIDER_SITE_OTHER): Payer: Self-pay | Admitting: Physical Medicine and Rehabilitation

## 2017-08-04 ENCOUNTER — Ambulatory Visit (INDEPENDENT_AMBULATORY_CARE_PROVIDER_SITE_OTHER): Payer: Self-pay

## 2017-08-04 ENCOUNTER — Ambulatory Visit (INDEPENDENT_AMBULATORY_CARE_PROVIDER_SITE_OTHER): Payer: Medicare HMO | Admitting: Physical Medicine and Rehabilitation

## 2017-08-04 VITALS — BP 128/68 | HR 65

## 2017-08-04 DIAGNOSIS — M5416 Radiculopathy, lumbar region: Secondary | ICD-10-CM | POA: Diagnosis not present

## 2017-08-04 DIAGNOSIS — M48062 Spinal stenosis, lumbar region with neurogenic claudication: Secondary | ICD-10-CM

## 2017-08-04 MED ORDER — BETAMETHASONE SOD PHOS & ACET 6 (3-3) MG/ML IJ SUSP
12.0000 mg | Freq: Once | INTRAMUSCULAR | Status: AC
  Administered 2017-08-04: 12 mg

## 2017-08-04 NOTE — Progress Notes (Signed)
.  Numeric Pain Rating Scale and Functional Assessment Average Pain 9   In the last MONTH (on 0-10 scale) has pain interfered with the following?  1. General activity like being  able to carry out your everyday physical activities such as walking, climbing stairs, carrying groceries, or moving a chair?  Rating(8)   +Driver, -BT, -Dye Allergies.  

## 2017-08-04 NOTE — Patient Instructions (Signed)

## 2017-08-09 NOTE — Progress Notes (Signed)
Kaitlin Salazar - 77 y.o. female MRN [id number]  Date of birth: Oct 30, 1940  Office Visit Note: Visit Date: 08/04/2017 PCP: Lyndel Safe., MD Referred by: Lyndel Safe., MD  Subjective: Chief Complaint  Patient presents with  . Lower Back - Pain  . Right Leg - Pain  . Left Leg - Pain   HPI: Kaitlin Salazar is a 77 year old female that I have seen in the past for epidural injection.  She has continued to be followed by Dr. Gaspar Bidding for multiple joint complaints as well as neck and back pain.  She has a history significant for fibromyalgia and severe arthritis.  She does have MRI from 2018 of her lumbar spine showing multilevel severe degenerative changes particularly with advanced stenosis at L2-3 L3-4 L4-5.  She is complaining of low back and bilateral hip and leg pain with paresthesia.  She reports that any real movement makes it worse.  She is failed good conservative care through Dr. Berline Chough.  Her case is complicated again by underlying fibromyalgia which I do think is a significant contributor to her overall pain complaints as well as generalized anxiety and history of 38 confirmed allergies.  She does have intolerance to dexamethasone and cortisone to a degree but is done well with prior epidural injection.  We will complete a diagnostic and hopefully therapeutic L4 transforaminal injection bilaterally.  All of her questions were answered today.   ROS Otherwise per HPI.  Assessment & Plan: Visit Diagnoses:  1. Lumbar radiculopathy   2. Spinal stenosis of lumbar region with neurogenic claudication     Plan: No additional findings.   Meds & Orders:  Meds ordered this encounter  Medications  . betamethasone acetate-betamethasone sodium phosphate (CELESTONE) injection 12 mg    Orders Placed This Encounter  Procedures  . XR C-ARM NO REPORT  . Epidural Steroid injection    Follow-up: Return if symptoms worsen or fail to improve.   Procedures: No procedures performed    Lumbosacral Transforaminal Epidural Steroid Injection - Sub-Pedicular Approach with Fluoroscopic Guidance  Patient: Kaitlin Salazar      Date of Birth: 1940-11-21 MRN: [id number] PCP: Lyndel Safe., MD      Visit Date: 08/04/2017   Universal Protocol:    Date/Time: 08/04/2017  Consent Given By: the patient  Position: PRONE  Additional Comments: Vital signs were monitored before and after the procedure. Patient was prepped and draped in the usual sterile fashion. The correct patient, procedure, and site was verified.   Injection Procedure Details:  Procedure Site One Meds Administered:  Meds ordered this encounter  Medications  . betamethasone acetate-betamethasone sodium phosphate (CELESTONE) injection 12 mg    Laterality: Bilateral  Location/Site:  L4-L5  Needle size: 22 G  Needle type: Spinal  Needle Placement: Transforaminal  Findings:    -Comments: Excellent flow of contrast along the nerve and into the epidural space.  Procedure Details: After squaring off the end-plates to get a true AP view, the C-arm was positioned so that an oblique view of the foramen as noted above was visualized. The target area is just inferior to the "nose of the scotty dog" or sub pedicular. The soft tissues overlying this structure were infiltrated with 2-3 ml. of 1% Lidocaine without Epinephrine.  The spinal needle was inserted toward the target using a "trajectory" view along the fluoroscope beam.  Under AP and lateral visualization, the needle was advanced so it did not puncture dura and was located close the 6 O'Clock position  of the pedical in AP tracterory. Biplanar projections were used to confirm position. Aspiration was confirmed to be negative for CSF and/or blood. A 1-2 ml. volume of Isovue-250 was injected and flow of contrast was noted at each level. Radiographs were obtained for documentation purposes.   After attaining the desired flow of contrast documented above, a  0.5 to 1.0 ml test dose of 0.25% Marcaine was injected into each respective transforaminal space.  The patient was observed for 90 seconds post injection.  After no sensory deficits were reported, and normal lower extremity motor function was noted,   the above injectate was administered so that equal amounts of the injectate were placed at each foramen (level) into the transforaminal epidural space.   Additional Comments:  The patient tolerated the procedure well Dressing: Band-Aid    Post-procedure details: Patient was observed during the procedure. Post-procedure instructions were reviewed.  Patient left the clinic in stable condition.    Clinical History: MRI LUMBAR SPINE WITHOUT CONTRAST  TECHNIQUE: Multiplanar, multisequence MR imaging of the lumbar spine was performed. No intravenous contrast was administered.  COMPARISON:  09/13/2008  FINDINGS: Segmentation:  Standard.  Alignment:  Mild scoliosis.  Vertebrae: Degenerative alteration of marrow. No acute fracture, discitis, or aggressive bone lesion.  Conus medullaris: Extends to the L1-2 disc level and appears normal.  Paraspinal and other soft tissues: Negative  Disc levels:  T12- L1: Disc narrowing and bulging. Spondylosis. Negative facets. No compressive stenosis.  L1-L2: Advanced degenerative disc narrowing with circumferential bulging and endplate ridging. Facet arthropathy with spurring greater on the left. Moderate to advanced spinal stenosis. Definite impingement in the left subarticular recess. Advanced left foraminal stenosis.  L2-L3: Advanced disc narrowing with circumferential bulging and endplate spurring. Superimposed right paracentral protrusion that is inferiorly migrating, new. Facet arthropathy with hypertrophy. High-grade spinal stenosis with bilateral subarticular recess impingement. Left more than right foraminal narrowing without L2 compression.  L3-L4: Facet arthropathy  with bulkier spurring on the right. Advanced disc narrowing with circumferential bulging and endplate ridging. Spinal stenosis is advanced with bilateral subarticular recess impingement. Advanced right foraminal stenosis, primarily from spurring and disc narrowing, with L3 compression. These changes are progressed.  L4-L5: Advanced disc narrowing with bulging and endplate ridging. Superimposed central disc protrusion, progressed. Advanced facet arthropathy with particularly severe bulky spurring on the right. Spinal stenosis is severe with no residual CSF. Severe right foraminal stenosis with L4 compression.  L5-S1:Degenerative disc narrowing and circumferential bulging with superimposed central protrusion. Disc narrowing has progressed. Facet arthropathy with bilateral spurring. Left more than right subarticular recess stenosis and S1 impingement, progressed. Advanced facet arthropathy with left L5 compression. Right foraminal narrowing is mild-to-moderate.  IMPRESSION: 1. Severe and diffuse degenerative changes that has progressed from 2010. 2. L1-2 moderate to advanced spinal stenosis. Left subarticular recess and foraminal impingement. 3. L2-3 high-grade spinal stenosis with impingement in the subarticular recesses. 4. L3-4 high-grade spinal and right foraminal stenosis. Right foraminal L3 compression. 5. L4-5 severe spinal and right foraminal stenosis. 6. L5-S1 left more than right subarticular recess stenosis and impingement. Left foraminal L5 impingement.   Electronically Signed   By: Marnee Spring M.D.   On: 04/06/2016 17:28   She reports that she has never smoked. She has never used smokeless tobacco. No results for input(s): HGBA1C, LABURIC in the last 8760 hours.  Objective:  VS:  HT:    WT:   BMI:     BP:128/68  HR:65bpm  TEMP: ( )  RESP:  Physical  Exam  Ortho Exam Imaging: No results found.  Past Medical/Family/Surgical/Social  History: Medications & Allergies reviewed per EMR, new medications updated. Patient Active Problem List   Diagnosis Date Noted  . Memory loss 06/14/2016  . Shoulder pain, bilateral 06/11/2016  . Knee osteoarthritis 03/17/2016  . Osteoarthritis of spine with radiculopathy, cervical region 03/17/2016  . Osteoarthritis of spine with radiculopathy, lumbar region 03/17/2016  . Thrombocytopenia (HCC) 02/12/2016  . PVC (premature ventricular contraction) 01/20/2016  . Controlled type 2 diabetes mellitus without complication, without long-term current use of insulin (HCC) 04/20/2015  . Benign essential HTN 02/03/2015  . Intertrigo 02/03/2015  . Type 2 diabetes mellitus without complication, without long-term current use of insulin (HCC) 02/03/2015  . Idiopathic cardiomyopathy (HCC) 11/26/2014  . Generalized anxiety disorder 04/29/2014  . Bipolar 2 disorder (HCC) 05/22/2013  . Esophageal reflux 06/17/2012  . Essential hypertension 06/17/2012  . Fibromyalgia 06/17/2012  . Hypercholesterolemia 06/17/2012  . Hyperlipidemia 06/17/2012   Past Medical History:  Diagnosis Date  . Allergy   . Anxiety   . Depression   . Diabetes mellitus without complication (HCC)   . Hyperlipidemia   . Hypertension    History reviewed. No pertinent family history. History reviewed. No pertinent surgical history. Social History   Occupational History  . Not on file  Tobacco Use  . Smoking status: Never Smoker  . Smokeless tobacco: Never Used  Substance and Sexual Activity  . Alcohol use: No  . Drug use: Not on file  . Sexual activity: Not on file

## 2017-08-09 NOTE — Procedures (Signed)
Lumbosacral Transforaminal Epidural Steroid Injection - Sub-Pedicular Approach with Fluoroscopic Guidance  Patient: Kaitlin Salazar      Date of Birth: 1940-02-24 MRN: [id number] PCP: Lyndel Safe., MD      Visit Date: 08/04/2017   Universal Protocol:    Date/Time: 08/04/2017  Consent Given By: the patient  Position: PRONE  Additional Comments: Vital signs were monitored before and after the procedure. Patient was prepped and draped in the usual sterile fashion. The correct patient, procedure, and site was verified.   Injection Procedure Details:  Procedure Site One Meds Administered:  Meds ordered this encounter  Medications  . betamethasone acetate-betamethasone sodium phosphate (CELESTONE) injection 12 mg    Laterality: Bilateral  Location/Site:  L4-L5  Needle size: 22 G  Needle type: Spinal  Needle Placement: Transforaminal  Findings:    -Comments: Excellent flow of contrast along the nerve and into the epidural space.  Procedure Details: After squaring off the end-plates to get a true AP view, the C-arm was positioned so that an oblique view of the foramen as noted above was visualized. The target area is just inferior to the "nose of the scotty dog" or sub pedicular. The soft tissues overlying this structure were infiltrated with 2-3 ml. of 1% Lidocaine without Epinephrine.  The spinal needle was inserted toward the target using a "trajectory" view along the fluoroscope beam.  Under AP and lateral visualization, the needle was advanced so it did not puncture dura and was located close the 6 O'Clock position of the pedical in AP tracterory. Biplanar projections were used to confirm position. Aspiration was confirmed to be negative for CSF and/or blood. A 1-2 ml. volume of Isovue-250 was injected and flow of contrast was noted at each level. Radiographs were obtained for documentation purposes.   After attaining the desired flow of contrast documented above, a  0.5 to 1.0 ml test dose of 0.25% Marcaine was injected into each respective transforaminal space.  The patient was observed for 90 seconds post injection.  After no sensory deficits were reported, and normal lower extremity motor function was noted,   the above injectate was administered so that equal amounts of the injectate were placed at each foramen (level) into the transforaminal epidural space.   Additional Comments:  The patient tolerated the procedure well Dressing: Band-Aid    Post-procedure details: Patient was observed during the procedure. Post-procedure instructions were reviewed.  Patient left the clinic in stable condition.

## 2017-08-17 ENCOUNTER — Ambulatory Visit: Payer: Medicare HMO | Admitting: Sports Medicine

## 2017-08-30 DIAGNOSIS — E785 Hyperlipidemia, unspecified: Secondary | ICD-10-CM | POA: Insufficient documentation

## 2017-08-30 DIAGNOSIS — I2510 Atherosclerotic heart disease of native coronary artery without angina pectoris: Secondary | ICD-10-CM | POA: Insufficient documentation

## 2017-09-08 ENCOUNTER — Other Ambulatory Visit: Payer: Self-pay | Admitting: Sports Medicine

## 2017-09-09 NOTE — Telephone Encounter (Signed)
Last OV 07/06/2017, last refill: 07/19/2017 #60/0 refills. No upcoming visits scheduled.

## 2017-10-27 ENCOUNTER — Other Ambulatory Visit: Payer: Self-pay | Admitting: Sports Medicine

## 2017-10-28 NOTE — Telephone Encounter (Signed)
Last OV: 07/06/2017 Next OV: n/a Last refill: 09/09/2017 #60/0.

## 2017-10-28 NOTE — Telephone Encounter (Signed)
1 additional refill provided.  Needs office visit for any further refills.

## 2017-12-06 ENCOUNTER — Other Ambulatory Visit: Payer: Self-pay | Admitting: Sports Medicine

## 2017-12-06 NOTE — Telephone Encounter (Signed)
Pt daughter called back stating she does have the gabapentin and forgot. Pt is out of the tramadol and has been for days. Requesting this to be refilled.   [id number] [place], [place] - [address] [place] [address] Phone: [phone] Fax: [phone]

## 2017-12-06 NOTE — Telephone Encounter (Signed)
Copied from CRM [id number]. Topic: Quick Communication - See Telephone Encounter >> Dec 06, 2017 11:43 AM Windy Kalata, NT wrote: CRM for notification. See Telephone encounter for: 12/06/17.  Patient is needing a refill on traMADol (ULTRAM) 50 MG tablet  and gabapentin (NEURONTIN) 300 MG capsule. Please advise.  [id number] [place], [place] - [address] [place] [address] Phone: [phone] Fax: [phone]

## 2017-12-06 NOTE — Telephone Encounter (Signed)
Forwarding to Dr. Rigby to advise.  

## 2017-12-06 NOTE — Telephone Encounter (Signed)
Pharmacy states neurontin refill picked up 11/26/17; pt unsure if taking; daughter manages meds. To CB to clarify

## 2017-12-07 MED ORDER — TRAMADOL HCL 50 MG PO TABS: 50.0000 mg | ORAL_TABLET | Freq: Two times a day (BID) | ORAL | 0 refills | Status: DC | PRN

## 2017-12-22 ENCOUNTER — Ambulatory Visit: Payer: Medicare HMO | Admitting: Sports Medicine

## 2017-12-23 ENCOUNTER — Ambulatory Visit: Payer: Medicare HMO | Admitting: Sports Medicine

## 2017-12-27 ENCOUNTER — Ambulatory Visit: Payer: Medicare HMO | Admitting: Sports Medicine

## 2018-01-02 ENCOUNTER — Ambulatory Visit: Payer: Medicare HMO | Admitting: Sports Medicine

## 2018-01-17 ENCOUNTER — Ambulatory Visit (INDEPENDENT_AMBULATORY_CARE_PROVIDER_SITE_OTHER): Payer: Medicare HMO | Admitting: Sports Medicine

## 2018-01-17 ENCOUNTER — Encounter: Payer: Self-pay | Admitting: Sports Medicine

## 2018-01-17 ENCOUNTER — Ambulatory Visit: Payer: Medicare HMO | Admitting: Sports Medicine

## 2018-01-17 ENCOUNTER — Ambulatory Visit: Payer: Self-pay

## 2018-01-17 VITALS — BP 116/74 | HR 71 | Ht 64.0 in | Wt 176.4 lb

## 2018-01-17 DIAGNOSIS — M797 Fibromyalgia: Secondary | ICD-10-CM | POA: Diagnosis not present

## 2018-01-17 DIAGNOSIS — M25552 Pain in left hip: Secondary | ICD-10-CM | POA: Diagnosis not present

## 2018-01-17 DIAGNOSIS — R262 Difficulty in walking, not elsewhere classified: Secondary | ICD-10-CM

## 2018-01-17 DIAGNOSIS — M1612 Unilateral primary osteoarthritis, left hip: Secondary | ICD-10-CM

## 2018-01-17 DIAGNOSIS — M4726 Other spondylosis with radiculopathy, lumbar region: Secondary | ICD-10-CM

## 2018-01-17 DIAGNOSIS — M1712 Unilateral primary osteoarthritis, left knee: Secondary | ICD-10-CM

## 2018-01-17 MED ORDER — GABAPENTIN 300 MG PO CAPS: 300.0000 mg | ORAL_CAPSULE | Freq: Every day | ORAL | 1 refills | Status: DC

## 2018-01-17 MED ORDER — TRAMADOL HCL 50 MG PO TABS: 50.0000 mg | ORAL_TABLET | Freq: Two times a day (BID) | ORAL | 0 refills | Status: DC | PRN

## 2018-01-17 NOTE — Procedures (Signed)
PROCEDURE NOTE:  Ultrasound Guided: Injection: Left hip Images were obtained and interpreted by myself, Gaspar Bidding, DO  Images have been saved and stored to PACS system. Images obtained on: GE S7 Ultrasound machine    ULTRASOUND FINDINGS:  Marked degenerative changes with a left hip joint effusion.  DESCRIPTION OF PROCEDURE:  The patient's clinical condition is marked by substantial pain and/or significant functional disability. Other conservative therapy has not provided relief, is contraindicated, or not appropriate. There is a reasonable likelihood that injection will significantly improve the patient's pain and/or functional impairment.   After discussing the risks, benefits and expected outcomes of the injection and all questions were reviewed and answered, the patient wished to undergo the above named procedure.  Verbal consent was obtained.  The ultrasound was used to identify the target structure and adjacent neurovascular structures. The skin was then prepped in sterile fashion and the target structure was injected under direct visualization using sterile technique as below:  Single injection performed as below: PREP: Alcohol and Ethel Chloride APPROACH:direct, stopcock technique, 22g 3.5 in. INJECTATE: 5 cc 1% lidocaine, 2 cc 0.5% Marcaine and 2 cc 40mg /mL DepoMedrol ASPIRATE: None DRESSING: Band-Aid  Post procedural instructions including recommending icing and warning signs for infection were reviewed.    This procedure was well tolerated and there were no complications.   IMPRESSION: Succesful Ultrasound Guided: Injection

## 2018-01-17 NOTE — Progress Notes (Signed)
Kaitlin Salazar. [name] at [facility] [phone]  Kaitlin Salazar - 78 y.o. female MRN [id number]  Date of birth: [birth year]  Visit Date:   PCP: Lyndel Safe., MD   Referred by: Lyndel Safe., MD   SUBJECTIVE:  Chief Complaint  Patient presents with  . f/u back pain    Had epidural inj with Dr. Alvester Morin 08/04/17. Taking Tylenol and Tramadol prn, also taking Gabapentin 300 mg. MRI L-spine 04/06/2016.   . f/u L knee pain    L knee injection 05/25/17.   . f/u B shoulder pain    R shoulder injection 05/25/17. MRI C-spine 09/03/14.   . L hip pain    radiating into L leg.     HPI: Patient presents for follow-up of her generalized body aches fibromyalgia, low back pain and left leg pain.  She has had issues for quite some time and has known fibromyalgia.  She is minimally active at home.  She denies any falls since her last visit.  She reports her biggest issue is left leg pain radiating from the hip into the knee.  She has pain with any type of ambulation and walks with a crouched over gait.  She spends majority of her day in a wheelchair at home.  REVIEW OF SYSTEMS: She denies any fevers or chills but does have nighttime disturbances with a difficult time falling asleep but ultimately does respond to gabapentin and one half tramadol at night.  HISTORY:  Prior history reviewed and updated per electronic medical record.  Social History   Occupational History  . Not on file  Tobacco Use  . Smoking status: Never Smoker  . Smokeless tobacco: Never Used  Substance and Sexual Activity  . Alcohol use: No  . Drug use: Not on file  . Sexual activity: Not on file   Social History   Social History Narrative  . Not on file      DATA OBTAINED & REVIEWED:  No results for input(s): HGBA1C, LABURIC, CREATINE, CALCIUM, AST, ALT, TSH in the last 8760 hours.  Invalid input(s): MAGNESIUM, CK No problems updated. No specialty comments  available.  OBJECTIVE:  VS:  HT:5\' 4"  (162.6 cm)   WT:176 lb 6.4 oz (80 kg)  BMI:30.26    BP:116/74  HR:71bpm  TEMP: ( )  RESP:98 %   PHYSICAL EXAM: Elderly female.  Poor insight.  Alert and appropriately interactive but unable to recall many details about her health history. She has marked pain with internal rotation of the left hip.  Left knee has generalized tenderness but no focality and no significant effusion.  Her knee is ligamentously stable today and extensor mechanism is intact.  She does have pain with Stinchfield testing on the left.  Mild pain with straight leg raise but she actually prefers a slightly bent knee position has a hard time laying completely supine.   ASSESSMENT  1. Pain of left hip joint   2. Osteoarthritis of spine with radiculopathy, lumbar region   3. Fibromyalgia   4. Primary osteoarthritis of left knee   5. Primary osteoarthritis of left hip     PLAN:  Pertinent additional documentation may be included in corresponding procedure notes, imaging studies, problem based documentation and patient instructions.  Procedures:  US Guided Injection per procedure note  Medications:  Meds ordered this encounter  Medications  . traMADol (ULTRAM) 50 MG tablet    Sig: Take 1 tablet (50 mg total)  by mouth every 12 (twelve) hours as needed. Needs OV for further refills    Dispense:  60 tablet    Refill:  0    Not to exceed 5 additional fills before 03/08/2018  . gabapentin (NEURONTIN) 300 MG capsule    Sig: Take 1 capsule (300 mg total) by mouth at bedtime.    Dispense:  90 capsule    Refill:  1    Discussion/Instructions: No problem-specific Assessment & Plan notes found for this encounter. Discussed red flag symptoms that warrant earlier emergent evaluation and patient voices understanding. Activity modifications and the importance of avoiding exacerbating activities (limiting pain to no more than a 4 / 10 during or following activity) recommended and  discussed. >50% of this 25 minutes minute visit spent in direct patient counseling and/or coordination of care. Discussion was focused on education regarding the in discussing the pathoetiology and anticipated clinical course of the above condition.   We will order home health PT for her given her worsening mobility.  Can consider repeat epidural steroid injections with Dr. Alvester Morin as she did respond well to the most recent one she had in July.  Did refill her occasions today but will have her discontinue the gabapentin during the day and continue just 300 mg at night.  Tramadol was refilled after checking the controlled substance database. 9-month follow-up.  She will call if they are interested in an epidural steroid injection with Dr. Alvester Morin. If any lack of improvement: consider further diagnostic evaluation with Repeat x-rays of the hip to evaluate for potential significant worsening of the left hip OA.   Return in about 3 months (around 04/18/2018).          [name], DO    [facility]

## 2018-01-17 NOTE — Patient Instructions (Signed)

## 2018-01-25 ENCOUNTER — Other Ambulatory Visit: Payer: Self-pay | Admitting: Sports Medicine

## 2018-01-26 NOTE — Telephone Encounter (Signed)
Last OV 01/17/2018 Last refill 01/17/2018 #60/0 Next OV 04/19/2018

## 2018-02-02 ENCOUNTER — Telehealth: Payer: Self-pay | Admitting: Sports Medicine

## 2018-02-02 NOTE — Telephone Encounter (Signed)
Copied from CRM [id number]. Topic: Quick Communication - Home Health Verbal Orders >> Feb 02, 2018  4:03 PM Angela Nevin wrote: Caller/Agency: [facility] Callback Number:  [phone] Requesting PT Frequency: 2x a week for 4 weeks and 1x a week for 4 weeks.   Mary also requests speech therapy eval for cognitive deficit.

## 2018-02-02 NOTE — Telephone Encounter (Signed)
Called [facility] back and provided verbal orders.

## 2018-02-02 NOTE — Telephone Encounter (Signed)
See note

## 2018-02-09 NOTE — Telephone Encounter (Signed)
Kaitlin Salazar with verbal order.

## 2018-02-09 NOTE — Telephone Encounter (Signed)
See note  Copied from CRM [id number]. Topic: Quick Communication - Home Health Verbal Orders >> Feb 09, 2018 10:01 AM Lynne Logan D wrote: Caller/Agency: Willaim Rayas / [facility] Callback Number: [phone] / Secure VM Requesting OT/PT/Skilled Nursing/Social Work: Speech Therapy Frequency: 1 week 1 / 2 week 2

## 2018-03-10 ENCOUNTER — Other Ambulatory Visit: Payer: Self-pay | Admitting: Sports Medicine

## 2018-03-10 MED ORDER — TRAMADOL HCL 50 MG PO TABS: 50.0000 mg | ORAL_TABLET | Freq: Two times a day (BID) | ORAL | 1 refills | Status: DC | PRN

## 2018-03-10 NOTE — Telephone Encounter (Signed)
See note

## 2018-03-10 NOTE — Telephone Encounter (Signed)
Last OV 01/17/2018 Last refill 01/26/18 #60/0 Next OV 04/19/18

## 2018-03-10 NOTE — Telephone Encounter (Signed)
Copied from CRM [id number]. Topic: Quick Communication - Rx Refill/Question >> Mar 10, 2018 12:38 PM Jolayne Haines L wrote: Medication: traMADol (ULTRAM) 50 MG tablet  Has the patient contacted their pharmacy? Yes (Agent: If no, request that the patient contact the pharmacy for the refill.) (Agent: If yes, when and what did the pharmacy advise?)  Preferred Pharmacy (with phone number or street name): [id number] [place], [place] - [address] [place] [address] Phone: [phone] Fax: [phone]    Agent: Please be advised that RX refills may take up to 3 business days. We ask that you follow-up with your pharmacy.

## 2018-03-10 NOTE — Telephone Encounter (Signed)
Requested medication (s) are due for refill today: yes  Requested medication (s) are on the active medication list: yes  Last refill:  01/26/18  Future visit scheduled: yes  Notes to clinic:  Not delegated    Requested Prescriptions  Pending Prescriptions Disp Refills   traMADol (ULTRAM) 50 MG tablet 60 tablet 0    Sig: Take 1 tablet (50 mg total) by mouth every 12 (twelve) hours as needed. Needs OV for further refills     Not Delegated - Analgesics:  Opioid Agonists Failed - 03/10/2018 12:44 PM      Failed - This refill cannot be delegated      Failed - Urine Drug Screen completed in last 360 days.      Passed - Valid encounter within last 6 months    Recent Outpatient Visits          1 month ago Difficulty walking   [facility] [place], Casimiro Needle D, DO   8 months ago Chronic pain of both shoulders   [facility] [address] Gaspar Bidding D, DO   9 months ago Chronic pain of both shoulders   [facility] [address] Gaspar Bidding D, DO   11 months ago Chronic pain of both shoulders   [facility] [place], [name], DO   1 year ago Memory loss   [facility] [place], [name], DO      Future Appointments            In 1 month [name], DO [facility] [place], [place]

## 2018-04-19 ENCOUNTER — Ambulatory Visit: Payer: Medicare HMO | Admitting: Sports Medicine

## 2018-06-26 ENCOUNTER — Ambulatory Visit (INDEPENDENT_AMBULATORY_CARE_PROVIDER_SITE_OTHER): Payer: Medicare HMO | Admitting: Family Medicine

## 2018-06-26 ENCOUNTER — Ambulatory Visit: Payer: Medicare HMO | Admitting: Family Medicine

## 2018-06-26 ENCOUNTER — Other Ambulatory Visit: Payer: Self-pay

## 2018-06-26 ENCOUNTER — Encounter: Payer: Self-pay | Admitting: Family Medicine

## 2018-06-26 DIAGNOSIS — M1612 Unilateral primary osteoarthritis, left hip: Secondary | ICD-10-CM | POA: Diagnosis not present

## 2018-06-26 DIAGNOSIS — M545 Low back pain, unspecified: Secondary | ICD-10-CM

## 2018-06-26 DIAGNOSIS — G8929 Other chronic pain: Secondary | ICD-10-CM

## 2018-06-26 DIAGNOSIS — M1711 Unilateral primary osteoarthritis, right knee: Secondary | ICD-10-CM

## 2018-06-26 DIAGNOSIS — M1712 Unilateral primary osteoarthritis, left knee: Secondary | ICD-10-CM

## 2018-06-26 MED ORDER — TRAMADOL HCL 50 MG PO TABS: 50.0000 mg | ORAL_TABLET | Freq: Two times a day (BID) | ORAL | 3 refills | Status: DC | PRN

## 2018-06-26 MED ORDER — GABAPENTIN 300 MG PO CAPS: 300.0000 mg | ORAL_CAPSULE | Freq: Every day | ORAL | 1 refills | Status: DC

## 2018-06-26 NOTE — Progress Notes (Signed)
   Office Visit Note   Patient: Kaitlin Salazar           Date of Birth: [birth year]           MRN: [id number] Visit Date: 06/26/2018 Requested by: Lemar Livings., MD No address on file PCP: Lemar Livings., MD  Subjective: Chief Complaint  Patient presents with  . Lower Back - Pain    Hurts down left leg.     HPI: She is here to reestablish care.  She has a long history of multiple joint pain related to arthritis.  Most severe areas have been lumbar spine and hips.  She had her right hip replaced several years ago.  She had her left hip injected this past year.  She has a history of severe lumbar stenosis.  She mainly sits in her walker most of the day, she does not ambulate much.  She has been using tramadol and gabapentin for her pain.  She has been struggling with memory loss in the past few years.               ROS: Denies fevers or chills.  All other systems were reviewed and are negative.  Objective: Vital Signs: There were no vitals taken for this visit.  Physical Exam:  General:  Alert and oriented, in no acute distress. Pulm:  Breathing unlabored. Psy:  Normal mood, congruent affect. Skin: No visible rash. Hands: No significant synovitis in any of her joints today. Low back: Diffusely tender along the paraspinous muscles of her lumbar spine.  No tenderness at the SI joints or in the sciatic notch. Left hip: Slight pain with passive flexion and internal rotation today. Knees: No joint effusion today.   Imaging: None today.  Assessment & Plan: 1.  Chronic multiple joint pain with multiple areas of arthritis. -Refilled gabapentin and tramadol. -Patient will follow-up in about 6 to 8 weeks for recheck. Time spent with patient was 45 minutes, with greater than 50% of the time spent in face-to-face counseling and coordination of care.

## 2018-10-10 ENCOUNTER — Ambulatory Visit (INDEPENDENT_AMBULATORY_CARE_PROVIDER_SITE_OTHER): Payer: Medicare HMO | Admitting: Family Medicine

## 2018-10-10 ENCOUNTER — Encounter: Payer: Self-pay | Admitting: Family Medicine

## 2018-10-10 DIAGNOSIS — M48062 Spinal stenosis, lumbar region with neurogenic claudication: Secondary | ICD-10-CM | POA: Diagnosis not present

## 2018-10-10 DIAGNOSIS — G8929 Other chronic pain: Secondary | ICD-10-CM | POA: Diagnosis not present

## 2018-10-10 DIAGNOSIS — M545 Low back pain, unspecified: Secondary | ICD-10-CM

## 2018-10-10 MED ORDER — GABAPENTIN 300 MG PO CAPS: 300.0000 mg | ORAL_CAPSULE | Freq: Every day | ORAL | 1 refills | Status: DC

## 2018-10-10 MED ORDER — TRAMADOL HCL 50 MG PO TABS: 50.0000 mg | ORAL_TABLET | Freq: Two times a day (BID) | ORAL | 3 refills | Status: DC | PRN

## 2018-10-10 NOTE — Progress Notes (Signed)
   Office Visit Note   Patient: Kaitlin Salazar           Date of Birth: 21-Apr-1940           MRN: [id number] Visit Date: 10/10/2018 Requested by: Lemar Livings., MD No address on file PCP: Lemar Livings., MD  Subjective: Chief Complaint  Patient presents with  . Lower Back - Pain    Pain in the left hip/lower back, with pain in the lower leg (knee down to foot). Fell today, off the bed onto the floor. Was hurting before this, but has worsened after the fall.    HPI: For follow-up chronic low back pain.  She is not doing well overall, unable to stand and walk very much.  She spends most of her time in a chair.  She has pain radiating into both legs with occasional numbness.  Using gabapentin and tramadol.  She slipped and fell out of bed this morning, not sure exactly how it happened or how she landed but she has been hurting a little bit more since this morning.              ROS:   All other systems were reviewed and are negative.  Objective: Vital Signs: There were no vitals taken for this visit.  Physical Exam:  General:  Alert and oriented, in no acute distress. Pulm:  Breathing unlabored. Psy:  Normal mood, congruent affect. Skin: No bruising. Low back: She is tender in the left posterior hip/gluteus area.  No significant tenderness over the spinous processes.  Imaging: None today.  Assessment & Plan: 1.  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  Refilled tramadol and gabapentin.  Follow-up in about 3 to 4 months.     Procedures: No procedures performed  No notes on file     PMFS History: Patient Active Problem List   Diagnosis Date Noted  . Memory loss 06/14/2016  . Shoulder pain, bilateral 06/11/2016  . Knee osteoarthritis 03/17/2016  . Osteoarthritis of spine with radiculopathy, cervical region 03/17/2016  . Osteoarthritis of spine with radiculopathy, lumbar region 03/17/2016  .  Thrombocytopenia ([place]) 02/12/2016  . PVC (premature ventricular contraction) 01/20/2016  . Controlled type 2 diabetes mellitus without complication, without long-term current use of insulin ([place]) 04/20/2015  . Benign essential HTN 02/03/2015  . Intertrigo 02/03/2015  . Type 2 diabetes mellitus without complication, without long-term current use of insulin ([place]) 02/03/2015  . 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 Generalized anxiety disorder 04/29/2014  . Bipolar 2 disorder ([place]) 05/22/2013  . Esophageal reflux 06/17/2012  . Essential hypertension 06/17/2012  . Fibromyalgia 06/17/2012  . Hypercholesterolemia 06/17/2012  . Hyperlipidemia 06/17/2012   Past Medical History:  Diagnosis Date  . Allergy   . Anxiety   . Depression   . Diabetes mellitus without complication ([place])   . Hyperlipidemia   . Hypertension     History reviewed. No pertinent family history.  History reviewed. No pertinent surgical history. Social History   Occupational History  . Not on file  Tobacco Use  . Smoking status: Never Smoker  . Smokeless tobacco: Never Used  Substance and Sexual Activity  . Alcohol use: No  . Drug use: Not on file  . Sexual activity: Not on file

## 2018-11-02 ENCOUNTER — Encounter: Payer: Medicare HMO | Admitting: Physical Medicine and Rehabilitation

## 2018-11-07 ENCOUNTER — Ambulatory Visit: Payer: Medicare HMO | Admitting: Family Medicine

## 2018-12-26 ENCOUNTER — Ambulatory Visit: Payer: Medicare HMO | Admitting: Family Medicine

## 2019-01-02 ENCOUNTER — Encounter: Payer: Medicare HMO | Admitting: Physical Medicine and Rehabilitation

## 2019-01-23 ENCOUNTER — Encounter: Payer: Medicare HMO | Admitting: Physical Medicine and Rehabilitation

## 2019-02-06 IMAGING — MR MR LUMBAR SPINE W/O CM
4 of 5 series · 25 of 48 positions shown · non-contrast
Comparison: 09/13/2008

CLINICAL DATA: Radicular leg pain.  Spondylosis.

EXAM:
MRI LUMBAR SPINE WITHOUT CONTRAST
TECHNIQUE: Multiplanar, multisequence MR imaging of the lumbar spine was
performed. No intravenous contrast was administered.

[Series 4: T1 · sagittal · 4.0mm · 0.55mm/px · 6 of 13 slices shown (1 of 2)]
[im 1/13]
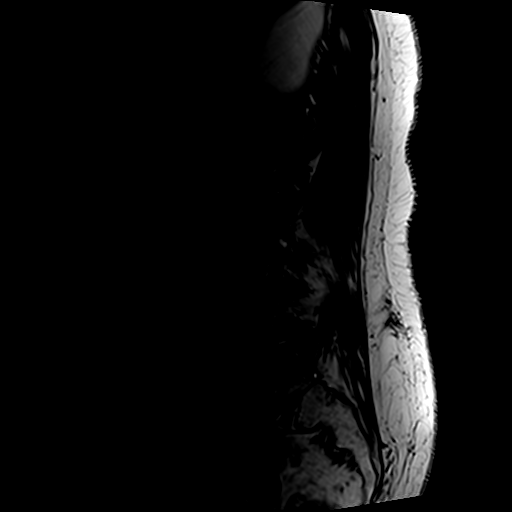
[im 3/13]
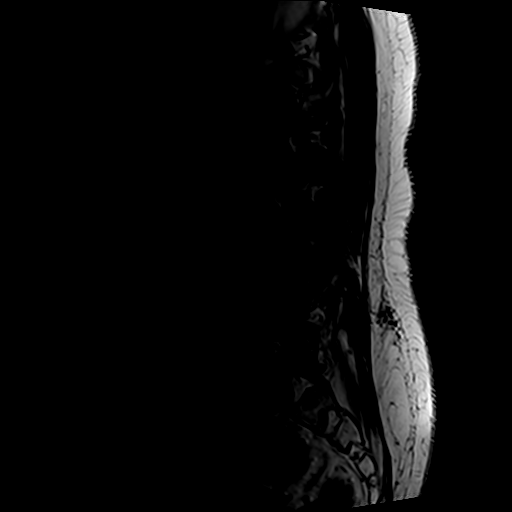
[im 5/13]
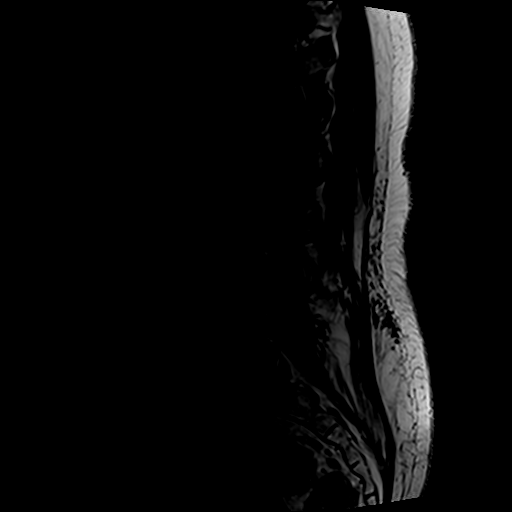
[im 8/13]
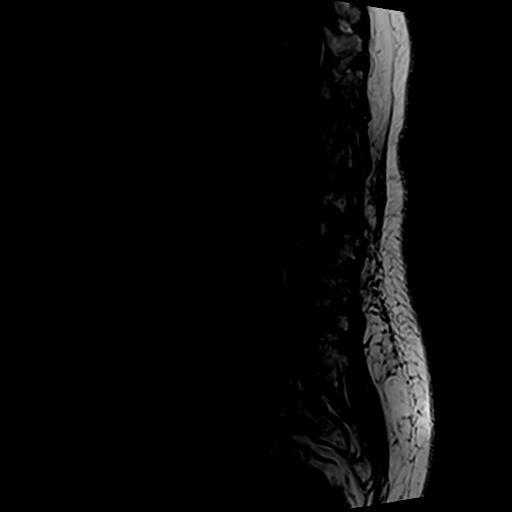
[im 10/13]
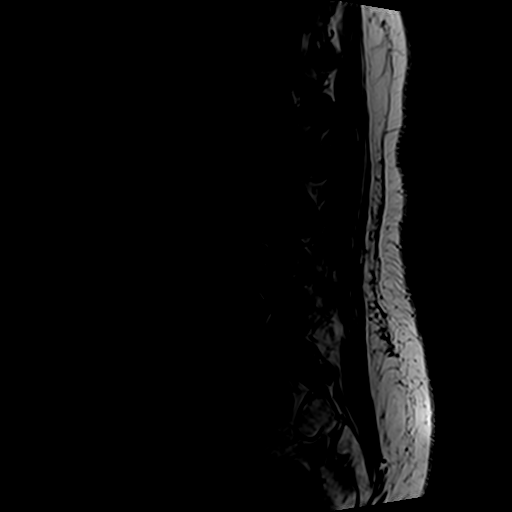
[im 13/13]
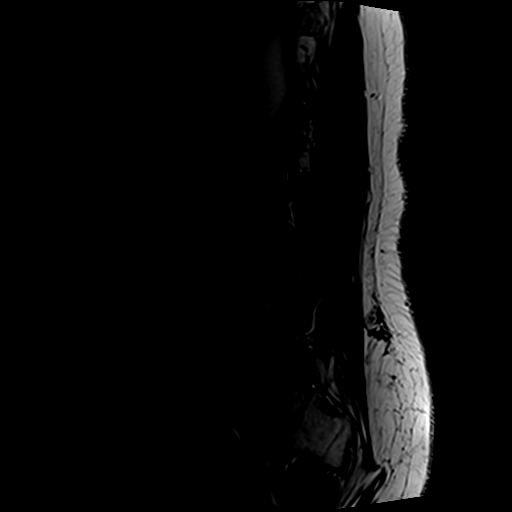

[Series 5: T2 post-contrast · sagittal · 4.0mm · 0.55mm/px · 6 of 13 slices shown]
[im 1/13]
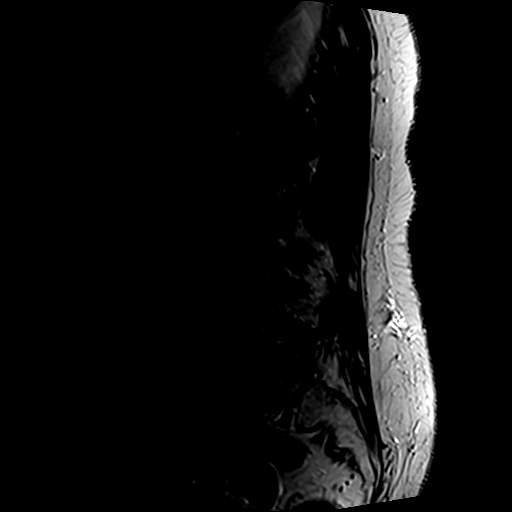
[im 3/13]
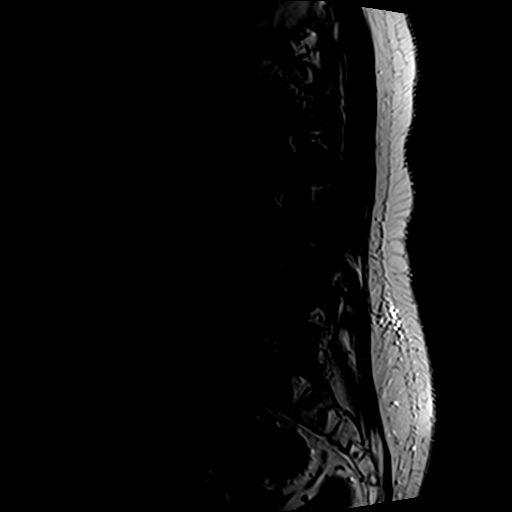
[im 5/13]
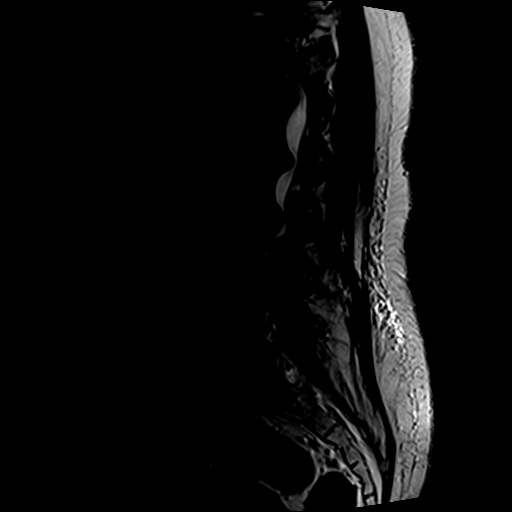
[im 8/13]
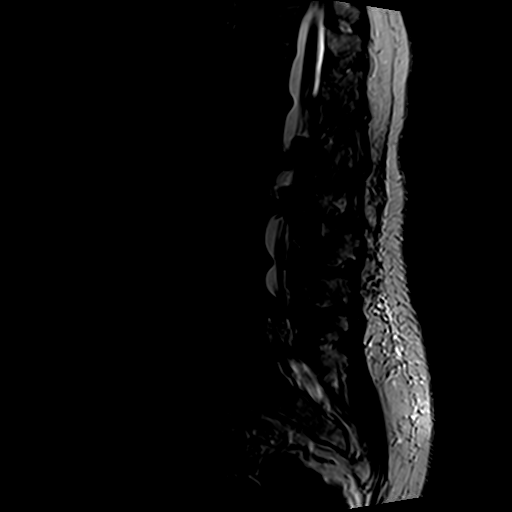
[im 10/13]
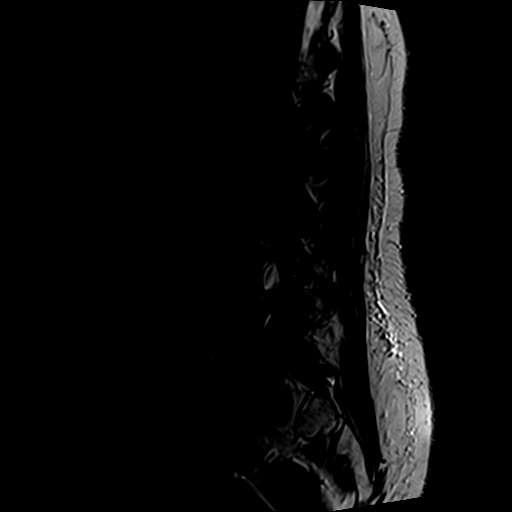
[im 13/13]
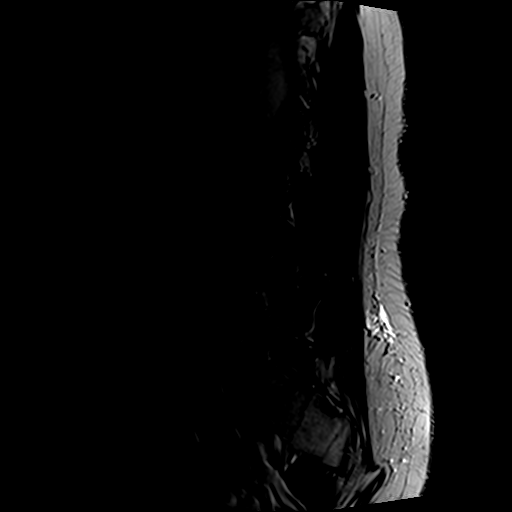

[Series 6: T2 · axial · 4.0mm · 0.70mm/px · z∈[-17,+174]mm · 9 of 35 slices shown]
[im 1/35]
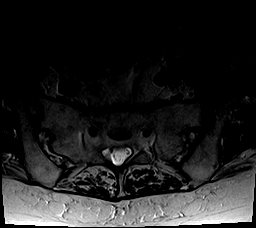
[im 5/35]
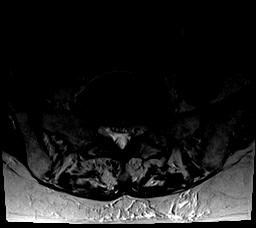
[im 10/35]
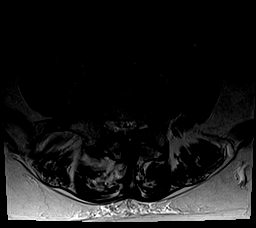
[im 15/35]
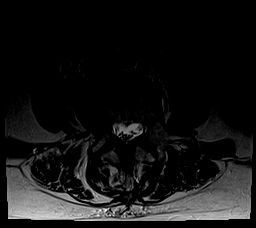
[im 18/35]
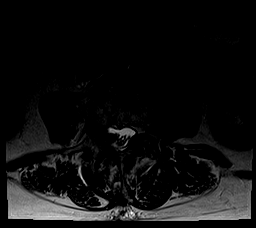
[im 20/35]
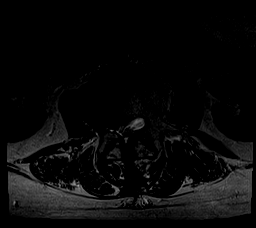
[im 25/35]
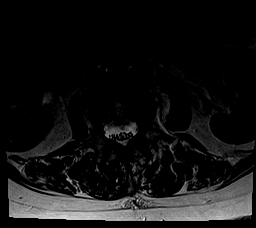
[im 30/35]
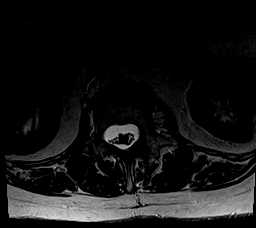
[im 35/35]
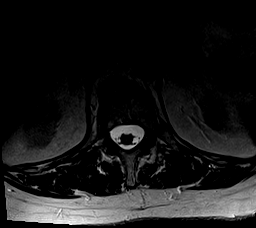

[Series 7: T1 · axial · 4.0mm · 0.35mm/px · z∈[-17,+149]mm · 4 of 35 slices shown (2 of 2)]
[im 1/35]
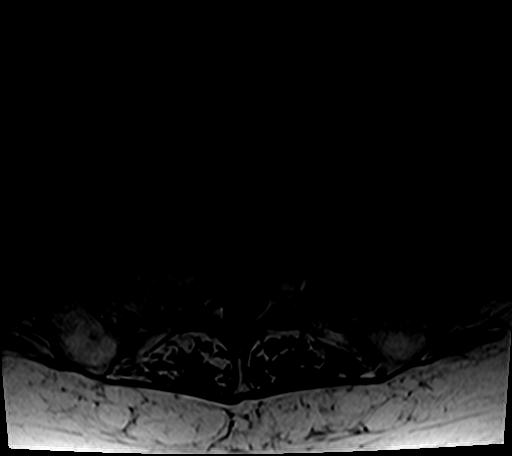
[im 5/35]
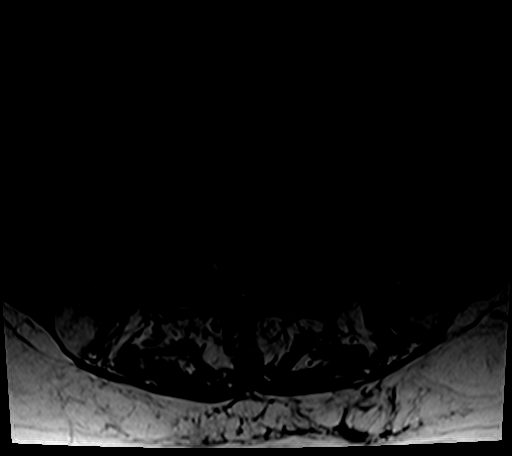
[im 18/35]
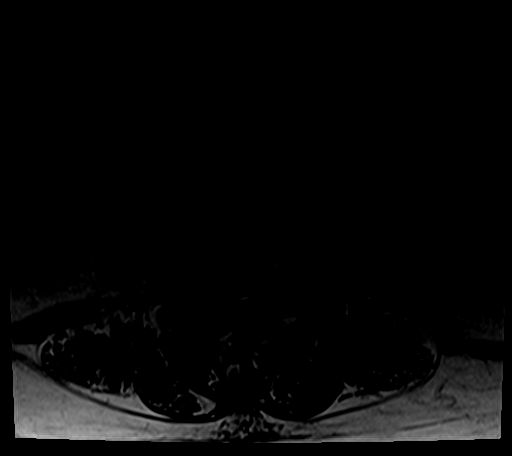
[im 30/35]
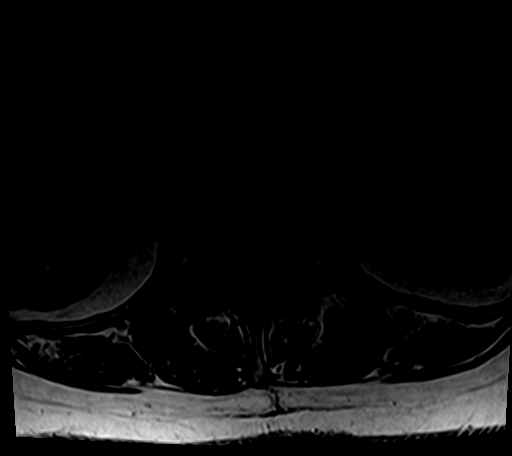

[25 of 48 positions shown; findings below may reference images not displayed]

FINDINGS: Segmentation:  Standard.

Alignment:  Mild scoliosis.

Vertebrae: Degenerative alteration of marrow. No acute fracture,
discitis, or aggressive bone lesion.

Conus medullaris: Extends to the L1-2 disc level and appears normal.

Paraspinal and other soft tissues: Negative

Disc levels:

T12- L1: Disc narrowing and bulging. Spondylosis. Negative facets.
No compressive stenosis.

L1-L2: Advanced degenerative disc narrowing with circumferential
bulging and endplate ridging. Facet arthropathy with spurring
greater on the left. Moderate to advanced spinal stenosis. Definite
impingement in the left subarticular recess. Advanced left foraminal
stenosis.

L2-L3: Advanced disc narrowing with circumferential bulging and
endplate spurring. Superimposed right paracentral protrusion that is
inferiorly migrating, new. Facet arthropathy with hypertrophy.
High-grade spinal stenosis with bilateral subarticular recess
impingement. Left more than right foraminal narrowing without L2
compression.

L3-L4: Facet arthropathy with bulkier spurring on the right.
Advanced disc narrowing with circumferential bulging and endplate
ridging. Spinal stenosis is advanced with bilateral subarticular
recess impingement. Advanced right foraminal stenosis, primarily
from spurring and disc narrowing, with L3 compression. These changes
are progressed.

L4-L5: Advanced disc narrowing with bulging and endplate ridging.
Superimposed central disc protrusion, progressed. Advanced facet
arthropathy with particularly severe bulky spurring on the right.
Spinal stenosis is severe with no residual CSF. Severe right
foraminal stenosis with L4 compression.

L5-S1:Degenerative disc narrowing and circumferential bulging with
superimposed central protrusion. Disc narrowing has progressed.
Facet arthropathy with bilateral spurring. Left more than right
subarticular recess stenosis and S1 impingement, progressed.
Advanced facet arthropathy with left L5 compression. Right foraminal
narrowing is mild-to-moderate.
IMPRESSION: 1. Severe and diffuse degenerative changes that has progressed from
3848.
2. L1-2 moderate to advanced spinal stenosis. Left subarticular
recess and foraminal impingement.
3. L2-3 high-grade spinal stenosis with impingement in the
subarticular recesses.
4. L3-4 high-grade spinal and right foraminal stenosis. Right
foraminal L3 compression.
5. L4-5 severe spinal and right foraminal stenosis.
6. L5-S1 left more than right subarticular recess stenosis and
impingement. Left foraminal L5 impingement.

## 2019-02-09 ENCOUNTER — Ambulatory Visit: Payer: Medicare HMO | Admitting: Family Medicine

## 2019-02-21 ENCOUNTER — Other Ambulatory Visit: Payer: Self-pay

## 2019-02-21 ENCOUNTER — Ambulatory Visit: Payer: Medicare HMO | Admitting: Physical Medicine and Rehabilitation

## 2019-02-21 ENCOUNTER — Ambulatory Visit: Payer: Self-pay

## 2019-02-21 ENCOUNTER — Encounter: Payer: Self-pay | Admitting: Physical Medicine and Rehabilitation

## 2019-02-21 VITALS — BP 124/62 | HR 68

## 2019-02-21 DIAGNOSIS — M48062 Spinal stenosis, lumbar region with neurogenic claudication: Secondary | ICD-10-CM

## 2019-02-21 DIAGNOSIS — M5416 Radiculopathy, lumbar region: Secondary | ICD-10-CM | POA: Diagnosis not present

## 2019-02-21 MED ORDER — METHYLPREDNISOLONE ACETATE 80 MG/ML IJ SUSP
40.0000 mg | Freq: Once | INTRAMUSCULAR | Status: AC
  Administered 2019-02-21: 16:00:00 40 mg

## 2019-02-21 NOTE — Progress Notes (Signed)
 .  Numeric Pain Rating Scale and Functional Assessment Average Pain 8   In the last MONTH (on 0-10 scale) has pain interfered with the following?  1. General activity like being  able to carry out your everyday physical activities such as walking, climbing stairs, carrying groceries, or moving a chair?  Rating(8)   +Driver, -BT, -Dye Allergies.  

## 2019-02-22 NOTE — Progress Notes (Signed)
Kaitlin Salazar - 79 y.o. female MRN [id number]  Date of birth: 1940/09/09  Office Visit Note: Visit Date: 02/21/2019 PCP: Lyndel Safe., MD Referred by: Lyndel Safe., MD  Subjective: Chief Complaint  Patient presents with  . Lower Back - Pain  . Left Leg - Pain  . Left Hip - Pain   HPI: Kaitlin Salazar is a 79 y.o. female who comes in today For planned lumbar epidural injection as requested by Dr. Lavada Mesi.  Prior injection at L4 from a transforaminal approach did give her some relief.  Patient is somewhat of a poor historian with complicated factor bipolar disorder and fibromyalgia pretty extensive..  She has 42 drug allergies or intolerances.  She has severe multilevel stenosis.  She has a hard time walking and standing for any length of time.  Her pain is more predominant left radicular pain.  We are going to complete a left L5 4 transforaminal injection today hopefully therapeutically and diagnostically.  ROS Otherwise per HPI.  Assessment & Plan: Visit Diagnoses:  1. Spinal stenosis of lumbar region with neurogenic claudication   2. Lumbar radiculopathy     Plan: No additional findings.   Meds & Orders:  Meds ordered this encounter  Medications  . methylPREDNISolone acetate (DEPO-MEDROL) injection 40 mg    Orders Placed This Encounter  Procedures  . XR C-ARM NO REPORT  . Epidural Steroid injection    Follow-up: Return if symptoms worsen or fail to improve.   Procedures: No procedures performed  Lumbosacral Transforaminal Epidural Steroid Injection - Sub-Pedicular Approach with Fluoroscopic Guidance  Patient: Kaitlin Salazar      Date of Birth: 06/08/1940 MRN: [id number] PCP: Lyndel Safe., MD      Visit Date: 02/21/2019   Universal Protocol:    Date/Time: 02/21/2019  Consent Given By: the patient  Position: PRONE  Additional Comments: Vital signs were monitored before and after the procedure. Patient was prepped and draped in the usual  sterile fashion. The correct patient, procedure, and site was verified.   Injection Procedure Details:  Procedure Site One Meds Administered:  Meds ordered this encounter  Medications  . methylPREDNISolone acetate (DEPO-MEDROL) injection 40 mg    Laterality: Left  Location/Site:  L4-L5  Needle size: 22 G  Needle type: Spinal  Needle Placement: Transforaminal  Findings:    -Comments: Excellent flow of contrast along the nerve and into the epidural space.  Procedure Details: After squaring off the end-plates to get a true AP view, the C-arm was positioned so that an oblique view of the foramen as noted above was visualized. The target area is just inferior to the "nose of the scotty dog" or sub pedicular. The soft tissues overlying this structure were infiltrated with 2-3 ml. of 1% Lidocaine without Epinephrine.  The spinal needle was inserted toward the target using a "trajectory" view along the fluoroscope beam.  Under AP and lateral visualization, the needle was advanced so it did not puncture dura and was located close the 6 O'Clock position of the pedical in AP tracterory. Biplanar projections were used to confirm position. Aspiration was confirmed to be negative for CSF and/or blood. A 1-2 ml. volume of Isovue-250 was injected and flow of contrast was noted at each level. Radiographs were obtained for documentation purposes.   After attaining the desired flow of contrast documented above, a 0.5 to 1.0 ml test dose of 0.25% Marcaine was injected into each respective transforaminal space.  The patient was observed for 90  seconds post injection.  After no sensory deficits were reported, and normal lower extremity motor function was noted,   the above injectate was administered so that equal amounts of the injectate were placed at each foramen (level) into the transforaminal epidural space.   Additional Comments:  The patient tolerated the procedure well Dressing: 2 x 2 sterile  gauze and Band-Aid    Post-procedure details: Patient was observed during the procedure. Post-procedure instructions were reviewed.  Patient left the clinic in stable condition.     Clinical History: MRI LUMBAR SPINE WITHOUT CONTRAST  TECHNIQUE: Multiplanar, multisequence MR imaging of the lumbar spine was performed. No intravenous contrast was administered.  COMPARISON:  09/13/2008  FINDINGS: Segmentation:  Standard.  Alignment:  Mild scoliosis.  Vertebrae: Degenerative alteration of marrow. No acute fracture, discitis, or aggressive bone lesion.  Conus medullaris: Extends to the L1-2 disc level and appears normal.  Paraspinal and other soft tissues: Negative  Disc levels:  T12- L1: Disc narrowing and bulging. Spondylosis. Negative facets. No compressive stenosis.  L1-L2: Advanced degenerative disc narrowing with circumferential bulging and endplate ridging. Facet arthropathy with spurring greater on the left. Moderate to advanced spinal stenosis. Definite impingement in the left subarticular recess. Advanced left foraminal stenosis.  L2-L3: Advanced disc narrowing with circumferential bulging and endplate spurring. Superimposed right paracentral protrusion that is inferiorly migrating, new. Facet arthropathy with hypertrophy. High-grade spinal stenosis with bilateral subarticular recess impingement. Left more than right foraminal narrowing without L2 compression.  L3-L4: Facet arthropathy with bulkier spurring on the right. Advanced disc narrowing with circumferential bulging and endplate ridging. Spinal stenosis is advanced with bilateral subarticular recess impingement. Advanced right foraminal stenosis, primarily from spurring and disc narrowing, with L3 compression. These changes are progressed.  L4-L5: Advanced disc narrowing with bulging and endplate ridging. Superimposed central disc protrusion, progressed. Advanced facet arthropathy  with particularly severe bulky spurring on the right. Spinal stenosis is severe with no residual CSF. Severe right foraminal stenosis with L4 compression.  L5-S1:Degenerative disc narrowing and circumferential bulging with superimposed central protrusion. Disc narrowing has progressed. Facet arthropathy with bilateral spurring. Left more than right subarticular recess stenosis and S1 impingement, progressed. Advanced facet arthropathy with left L5 compression. Right foraminal narrowing is mild-to-moderate.  IMPRESSION: 1. Severe and diffuse degenerative changes that has progressed from 2010. 2. L1-2 moderate to advanced spinal stenosis. Left subarticular recess and foraminal impingement. 3. L2-3 high-grade spinal stenosis with impingement in the subarticular recesses. 4. L3-4 high-grade spinal and right foraminal stenosis. Right foraminal L3 compression. 5. L4-5 severe spinal and right foraminal stenosis. 6. L5-S1 left more than right subarticular recess stenosis and impingement. Left foraminal L5 impingement.   Electronically Signed   By: Monte Fantasia M.D.   On: 04/06/2016 17:28   She reports that she has never smoked. She has never used smokeless tobacco. No results for input(s): HGBA1C, LABURIC in the last 8760 hours.  Objective:  VS:  HT:    WT:   BMI:     BP:124/62  HR:68bpm  TEMP: ( )  RESP:  Physical Exam  Ortho Exam Imaging: XR C-ARM NO REPORT  Result Date: 02/21/2019 Please see Notes tab for imaging impression.   Past Medical/Family/Surgical/Social History: Medications & Allergies reviewed per EMR, new medications updated. Patient Active Problem List   Diagnosis Date Noted  . Memory loss 06/14/2016  . Shoulder pain, bilateral 06/11/2016  . Knee osteoarthritis 03/17/2016  . Osteoarthritis of spine with radiculopathy, cervical region 03/17/2016  . Osteoarthritis of spine  with radiculopathy, lumbar region 03/17/2016  . Thrombocytopenia (HCC)  02/12/2016  . PVC (premature ventricular contraction) 01/20/2016  . Controlled type 2 diabetes mellitus without complication, without long-term current use of insulin (HCC) 04/20/2015  . Benign essential HTN 02/03/2015  . Intertrigo 02/03/2015  . Type 2 diabetes mellitus without complication, without long-term current use of insulin (HCC) 02/03/2015  . Idiopathic cardiomyopathy (HCC) 11/26/2014  . Generalized anxiety disorder 04/29/2014  . Bipolar 2 disorder (HCC) 05/22/2013  . Esophageal reflux 06/17/2012  . Essential hypertension 06/17/2012  . Fibromyalgia 06/17/2012  . Hypercholesterolemia 06/17/2012  . Hyperlipidemia 06/17/2012   Past Medical History:  Diagnosis Date  . Allergy   . Anxiety   . Depression   . Diabetes mellitus without complication (HCC)   . Hyperlipidemia   . Hypertension    History reviewed. No pertinent family history. History reviewed. No pertinent surgical history. Social History   Occupational History  . Not on file  Tobacco Use  . Smoking status: Never Smoker  . Smokeless tobacco: Never Used  Substance and Sexual Activity  . Alcohol use: No  . Drug use: Not on file  . Sexual activity: Not on file

## 2019-02-22 NOTE — Procedures (Signed)
Lumbosacral Transforaminal Epidural Steroid Injection - Sub-Pedicular Approach with Fluoroscopic Guidance  Patient: Kaitlin Salazar      Date of Birth: 10/12/1940 MRN: [id number] PCP: Lyndel Safe., MD      Visit Date: 02/21/2019   Universal Protocol:    Date/Time: 02/21/2019  Consent Given By: the patient  Position: PRONE  Additional Comments: Vital signs were monitored before and after the procedure. Patient was prepped and draped in the usual sterile fashion. The correct patient, procedure, and site was verified.   Injection Procedure Details:  Procedure Site One Meds Administered:  Meds ordered this encounter  Medications  . methylPREDNISolone acetate (DEPO-MEDROL) injection 40 mg    Laterality: Left  Location/Site:  L4-L5  Needle size: 22 G  Needle type: Spinal  Needle Placement: Transforaminal  Findings:    -Comments: Excellent flow of contrast along the nerve and into the epidural space.  Procedure Details: After squaring off the end-plates to get a true AP view, the C-arm was positioned so that an oblique view of the foramen as noted above was visualized. The target area is just inferior to the "nose of the scotty dog" or sub pedicular. The soft tissues overlying this structure were infiltrated with 2-3 ml. of 1% Lidocaine without Epinephrine.  The spinal needle was inserted toward the target using a "trajectory" view along the fluoroscope beam.  Under AP and lateral visualization, the needle was advanced so it did not puncture dura and was located close the 6 O'Clock position of the pedical in AP tracterory. Biplanar projections were used to confirm position. Aspiration was confirmed to be negative for CSF and/or blood. A 1-2 ml. volume of Isovue-250 was injected and flow of contrast was noted at each level. Radiographs were obtained for documentation purposes.   After attaining the desired flow of contrast documented above, a 0.5 to 1.0 ml test dose of  0.25% Marcaine was injected into each respective transforaminal space.  The patient was observed for 90 seconds post injection.  After no sensory deficits were reported, and normal lower extremity motor function was noted,   the above injectate was administered so that equal amounts of the injectate were placed at each foramen (level) into the transforaminal epidural space.   Additional Comments:  The patient tolerated the procedure well Dressing: 2 x 2 sterile gauze and Band-Aid    Post-procedure details: Patient was observed during the procedure. Post-procedure instructions were reviewed.  Patient left the clinic in stable condition.

## 2019-03-14 ENCOUNTER — Ambulatory Visit: Payer: Medicare HMO | Admitting: Family Medicine

## 2019-03-23 ENCOUNTER — Other Ambulatory Visit: Payer: Self-pay | Admitting: Family Medicine

## 2019-03-23 MED ORDER — GABAPENTIN 300 MG PO CAPS: 300.0000 mg | ORAL_CAPSULE | Freq: Every day | ORAL | 1 refills | Status: DC

## 2019-04-30 ENCOUNTER — Telehealth: Payer: Self-pay | Admitting: Family Medicine

## 2019-04-30 MED ORDER — TRAMADOL HCL 50 MG PO TABS: 50.0000 mg | ORAL_TABLET | Freq: Two times a day (BID) | ORAL | 3 refills | Status: DC | PRN

## 2019-04-30 NOTE — Telephone Encounter (Signed)
Please advise 

## 2019-04-30 NOTE — Addendum Note (Signed)
Addended by: Lillia Carmel on: 04/30/2019 01:24 PM   Modules accepted: Orders

## 2019-04-30 NOTE — Telephone Encounter (Signed)
Left a full message on the patient's voice mail that her medication refill was sent in to her pharmacy.

## 2019-04-30 NOTE — Telephone Encounter (Signed)
Patient's daughter called.  They are requesting a refill on tramadol   Call back: [phone]

## 2019-04-30 NOTE — Telephone Encounter (Signed)
Sent!

## 2019-05-08 ENCOUNTER — Ambulatory Visit: Payer: Medicare HMO | Admitting: Family Medicine

## 2019-05-15 ENCOUNTER — Encounter: Payer: Self-pay | Admitting: Family Medicine

## 2019-05-15 ENCOUNTER — Ambulatory Visit (INDEPENDENT_AMBULATORY_CARE_PROVIDER_SITE_OTHER): Payer: Medicare HMO

## 2019-05-15 ENCOUNTER — Ambulatory Visit (INDEPENDENT_AMBULATORY_CARE_PROVIDER_SITE_OTHER): Payer: Medicare HMO | Admitting: Family Medicine

## 2019-05-15 ENCOUNTER — Other Ambulatory Visit: Payer: Self-pay

## 2019-05-15 DIAGNOSIS — M1612 Unilateral primary osteoarthritis, left hip: Secondary | ICD-10-CM

## 2019-05-15 DIAGNOSIS — M48062 Spinal stenosis, lumbar region with neurogenic claudication: Secondary | ICD-10-CM | POA: Diagnosis not present

## 2019-05-15 MED ORDER — GABAPENTIN 300 MG PO CAPS: 300.0000 mg | ORAL_CAPSULE | Freq: Every day | ORAL | 1 refills | Status: DC

## 2019-05-15 MED ORDER — TRAMADOL HCL 50 MG PO TABS: 50.0000 mg | ORAL_TABLET | Freq: Two times a day (BID) | ORAL | 3 refills | Status: DC | PRN

## 2019-05-15 NOTE — Progress Notes (Signed)
Office Visit Note   Patient: Kaitlin Salazar           Date of Birth: 03-05-1940           MRN: [id number] Visit Date: 05/15/2019 Requested by: Lemar Livings., MD No address on file PCP: Lemar Livings., MD  Subjective: Chief Complaint  Patient presents with  . Left Hip - Pain    Pain in hip/buttock and down the leg. [place] today in the shower.    HPI: She is here with low back and left hip pain.  Today she fell in the shower, landed backwards on her backside.  It was very difficult to get back up again but eventually she was able.  She has had increased pain in her low back and left posterior hip.  She has a history of severe lumbar stenosis and left hip osteoarthritis.                ROS:   All other systems were reviewed and are negative.  Objective: Vital Signs: There were no vitals taken for this visit.  Physical Exam:  General:  Alert and oriented, in no acute distress. Pulm:  Breathing unlabored. Psy:  Normal mood, congruent affect. Skin: No bruising or skin breakdown seen. Low back: She is tender to palpation over the L3-S1 level in the midline.  She is also tender in the left sciatic notch and over the greater trochanter.  She has pain and decreased range of motion with internal hip rotation but not severe pain.  Imaging: XR Lumbar Spine 2-3 Views  Result Date: 05/15/2019 X-ray lumbar spine: She has degenerative scoliosis.  Moderate to severe degenerative disc disease at multiple levels.  I do not see any compression fractures compared to previous lumbar x-rays.  XR Pelvis 1-2 Views  Result Date: 05/15/2019 X-rays pelvis: She has moderate to severe left hip DJD.  Right hip prosthesis looks intact with no sign of loosening.  I do not see any pelvic fractures.   Assessment & Plan: 1.  Acute on chronic low back and left hip pain, no definite fracture seen. -Refilled her usual medications.  We will arrange home health physical therapy.     Procedures: No  procedures performed  No notes on file     PMFS History: Patient Active Problem List   Diagnosis Date Noted  . Memory loss 06/14/2016  . Shoulder pain, bilateral 06/11/2016  . Knee osteoarthritis 03/17/2016  . Osteoarthritis of spine with radiculopathy, cervical region 03/17/2016  . Osteoarthritis of spine with radiculopathy, lumbar region 03/17/2016  . Thrombocytopenia ([place]) 02/12/2016  . PVC (premature ventricular contraction) 01/20/2016  . Controlled type 2 diabetes mellitus without complication, without long-term current use of insulin ([place]) 04/20/2015  . Benign essential HTN 02/03/2015  . Intertrigo 02/03/2015  . Type 2 diabetes mellitus without complication, without long-term current use of insulin ([place]) 02/03/2015  . Idiopathic cardiomyopathy ([place]) 11/26/2014  . Generalized anxiety disorder 04/29/2014  . Bipolar 2 disorder ([place]) 05/22/2013  . Esophageal reflux 06/17/2012  . Essential hypertension 06/17/2012  . Fibromyalgia 06/17/2012  . Hypercholesterolemia 06/17/2012  . Hyperlipidemia 06/17/2012   Past Medical History:  Diagnosis Date  . Allergy   . Anxiety   . Depression   . Diabetes mellitus without complication ([place])   . Hyperlipidemia   . Hypertension     History reviewed. No pertinent family history.  History reviewed. No pertinent surgical history. Social History   Occupational History  . Not on file  Tobacco Use  . Smoking status: Never Smoker  . Smokeless tobacco: Never Used  Substance and Sexual Activity  . Alcohol use: No  . Drug use: Not on file  . Sexual activity: Not on file

## 2019-05-17 ENCOUNTER — Telehealth: Payer: Self-pay | Admitting: *Deleted

## 2019-05-17 NOTE — Telephone Encounter (Signed)
I called pt left message on vm to return my call, [facility] is out of network with pt insurance and im wainting to know if she has been to another facility.

## 2019-05-21 ENCOUNTER — Telehealth: Payer: Self-pay | Admitting: Family Medicine

## 2019-05-21 NOTE — Telephone Encounter (Signed)
See message.

## 2019-05-21 NOTE — Telephone Encounter (Signed)
Patient's daughter called.   She wants a call back to discuss home PT for the patient.   Call back: [phone]

## 2019-05-22 NOTE — Telephone Encounter (Signed)
Contacted pt daughter and informed her I sent order over to [facility] on 05/18/19. Daughter is going to call to check status

## 2019-06-05 ENCOUNTER — Telehealth: Payer: Self-pay | Admitting: *Deleted

## 2019-06-05 NOTE — Telephone Encounter (Signed)
Received call from Molli Hazard in regarding this pt, I tried calling back, left vm to return my call.

## 2019-08-07 ENCOUNTER — Other Ambulatory Visit: Payer: Self-pay | Admitting: Family Medicine

## 2019-08-07 DIAGNOSIS — M48062 Spinal stenosis, lumbar region with neurogenic claudication: Secondary | ICD-10-CM

## 2019-08-07 DIAGNOSIS — M1612 Unilateral primary osteoarthritis, left hip: Secondary | ICD-10-CM

## 2019-08-27 ENCOUNTER — Telehealth: Payer: Self-pay

## 2019-08-27 NOTE — Telephone Encounter (Signed)
Left voice mail on Kelly's mobile (daughter) to call me back regarding the HHPT and whether or not she wants [place] to proceed. If so, Mackayla will need to be seen by Dr. Prince Rome first, per [facility].

## 2019-08-27 NOTE — Telephone Encounter (Signed)
Magda Paganini with [facility] left message on voicemail Friday @ 3:11 pm stating that they would need a new face to face office note on this patient. The note they have is back in May where Dr. Oleta Mouse had seen the patient.

## 2019-08-27 NOTE — Telephone Encounter (Signed)
There was still an open referral for HHPT.  I called daughter, she just did not get patient scheduled earlier when referral was made.  Now see note below.  I am closing the HHPT referral that is still open.  Patient will need to be seen again before we can order.  Nanie Dunkleberger I defer to you to call daughter and inquire if they want to still proceed with this?  Thanks.

## 2019-08-28 NOTE — Telephone Encounter (Signed)
The patient will be coming in on 09/04/19 to see Dr. Prince Rome.

## 2019-09-04 ENCOUNTER — Ambulatory Visit: Payer: Medicare HMO | Admitting: Family Medicine

## 2019-09-11 ENCOUNTER — Ambulatory Visit: Payer: Medicare HMO | Admitting: Family Medicine

## 2019-09-18 ENCOUNTER — Ambulatory Visit: Payer: Medicare HMO | Admitting: Family Medicine

## 2019-09-21 ENCOUNTER — Ambulatory Visit (INDEPENDENT_AMBULATORY_CARE_PROVIDER_SITE_OTHER): Payer: Medicare HMO | Admitting: Family Medicine

## 2019-09-21 ENCOUNTER — Other Ambulatory Visit: Payer: Self-pay

## 2019-09-21 ENCOUNTER — Encounter: Payer: Self-pay | Admitting: Family Medicine

## 2019-09-21 DIAGNOSIS — M48062 Spinal stenosis, lumbar region with neurogenic claudication: Secondary | ICD-10-CM

## 2019-09-21 MED ORDER — TRAMADOL HCL 50 MG PO TABS: 100.0000 mg | ORAL_TABLET | Freq: Every evening | ORAL | 1 refills | Status: DC | PRN

## 2019-09-21 NOTE — Progress Notes (Signed)
   Office Visit Note   Patient: Kaitlin Salazar           Date of Birth: 11-23-1940           MRN: [id number] Visit Date: 09/21/2019 Requested by: Lyndel Safe., MD No address on file PCP: Lyndel Safe., MD  Subjective: Chief Complaint  Patient presents with  . Lower Back - Pain    Pain across lower back and down the left leg to the ankle. Walks with cane.    HPI: She is here for follow-up low back pain and bilateral hip pain.  Since last visit she has not started home health therapy, did not get it arranged and her referral expired.  She is still having daily pain.  She takes tramadol at night and that seems to help.  Has been taking 75 mg, but it is difficult to manage one half of a pill.  She wonders whether the dosage can be changed.              ROS:   All other systems were reviewed and are negative.  Objective: Vital Signs: There were no vitals taken for this visit.  Physical Exam:  General:  Alert and oriented, in no acute distress. Pulm:  Breathing unlabored. Psy:  Normal mood, congruent affect.  Low back: She is tender across the midline L5-S1 area and in both posterior hips.  Negative straight leg raise.  Bilateral hip pain with internal rotation.  Imaging: No results found.  Assessment & Plan: 1.  Chronic back pain due to spinal stenosis.  Bilateral hip DJD. -Proceed with home health physical therapy.  Prescription given for tramadol 100 mg at bedtime as needed.     Procedures: No procedures performed  No notes on file     PMFS History: Patient Active Problem List   Diagnosis Date Noted  . Memory loss 06/14/2016  . Shoulder pain, bilateral 06/11/2016  . Knee osteoarthritis 03/17/2016  . Osteoarthritis of spine with radiculopathy, cervical region 03/17/2016  . Osteoarthritis of spine with radiculopathy, lumbar region 03/17/2016  . Thrombocytopenia (HCC) 02/12/2016  . PVC (premature ventricular contraction) 01/20/2016  . Controlled type 2  diabetes mellitus without complication, without long-term current use of insulin (HCC) 04/20/2015  . Benign essential HTN 02/03/2015  . Intertrigo 02/03/2015  . Type 2 diabetes mellitus without complication, without long-term current use of insulin (HCC) 02/03/2015  . Idiopathic cardiomyopathy (HCC) 11/26/2014  . Generalized anxiety disorder 04/29/2014  . Bipolar 2 disorder (HCC) 05/22/2013  . Esophageal reflux 06/17/2012  . Essential hypertension 06/17/2012  . Fibromyalgia 06/17/2012  . Hypercholesterolemia 06/17/2012  . Hyperlipidemia 06/17/2012   Past Medical History:  Diagnosis Date  . Allergy   . Anxiety   . Depression   . Diabetes mellitus without complication (HCC)   . Hyperlipidemia   . Hypertension     History reviewed. No pertinent family history.  History reviewed. No pertinent surgical history. Social History   Occupational History  . Not on file  Tobacco Use  . Smoking status: Never Smoker  . Smokeless tobacco: Never Used  Substance and Sexual Activity  . Alcohol use: No  . Drug use: Not on file  . Sexual activity: Not on file

## 2019-09-28 ENCOUNTER — Other Ambulatory Visit: Payer: Self-pay | Admitting: Family Medicine

## 2019-09-28 DIAGNOSIS — G8929 Other chronic pain: Secondary | ICD-10-CM

## 2019-09-28 DIAGNOSIS — M545 Low back pain, unspecified: Secondary | ICD-10-CM

## 2019-09-28 DIAGNOSIS — M1612 Unilateral primary osteoarthritis, left hip: Secondary | ICD-10-CM

## 2019-09-28 DIAGNOSIS — M1711 Unilateral primary osteoarthritis, right knee: Secondary | ICD-10-CM

## 2019-09-28 DIAGNOSIS — M48062 Spinal stenosis, lumbar region with neurogenic claudication: Secondary | ICD-10-CM

## 2019-09-28 DIAGNOSIS — M1712 Unilateral primary osteoarthritis, left knee: Secondary | ICD-10-CM

## 2019-12-29 ENCOUNTER — Other Ambulatory Visit: Payer: Self-pay | Admitting: Family Medicine

## 2020-02-26 ENCOUNTER — Other Ambulatory Visit: Payer: Self-pay | Admitting: Family Medicine

## 2020-07-02 ENCOUNTER — Telehealth: Payer: Self-pay | Admitting: Family Medicine

## 2020-07-02 NOTE — Telephone Encounter (Signed)
Pt daughter called and pt is out of tramadol and needs refill.   CB [phone]

## 2020-07-02 NOTE — Telephone Encounter (Signed)
Please advise 

## 2020-07-03 ENCOUNTER — Other Ambulatory Visit: Payer: Self-pay | Admitting: Family Medicine

## 2020-07-03 MED ORDER — TRAMADOL HCL 50 MG PO TABS: 100.0000 mg | ORAL_TABLET | Freq: Every evening | ORAL | 1 refills | Status: AC | PRN

## 2020-07-03 NOTE — Telephone Encounter (Signed)
I called and left this message on the voice mail.

## 2023-12-12 DEATH — deceased

## 2024-01-12 DEATH — deceased
